# Patient Record
Sex: Female | Born: 1970 | Race: White | Hispanic: No | Marital: Single | Smoking: Former smoker
Health system: Southern US, Community
[De-identification: ages and names within clinical notes are randomized; demographics above are authoritative.]

## PROBLEM LIST (undated history)

## (undated) DIAGNOSIS — K219 Gastro-esophageal reflux disease without esophagitis: Secondary | ICD-10-CM

## (undated) DIAGNOSIS — G4700 Insomnia, unspecified: Secondary | ICD-10-CM

## (undated) DIAGNOSIS — F410 Panic disorder [episodic paroxysmal anxiety] without agoraphobia: Secondary | ICD-10-CM

## (undated) DIAGNOSIS — M62838 Other muscle spasm: Secondary | ICD-10-CM

## (undated) DIAGNOSIS — I10 Essential (primary) hypertension: Secondary | ICD-10-CM

## (undated) HISTORY — PX: PARTIAL HYSTERECTOMY: SHX80

## (undated) HISTORY — DX: Other muscle spasm: M62.838

## (undated) HISTORY — PX: ABDOMINAL HYSTERECTOMY: SHX81

---

## 1998-10-23 ENCOUNTER — Other Ambulatory Visit: Admission: RE | Admit: 1998-10-23 | Discharge: 1998-10-23 | Payer: Self-pay | Admitting: *Deleted

## 2002-08-23 ENCOUNTER — Encounter: Payer: Self-pay | Admitting: Orthopaedic Surgery

## 2002-08-23 ENCOUNTER — Encounter (HOSPITAL_COMMUNITY): Admission: RE | Admit: 2002-08-23 | Discharge: 2002-09-22 | Payer: Self-pay | Admitting: Orthopaedic Surgery

## 2003-02-11 ENCOUNTER — Emergency Department (HOSPITAL_COMMUNITY): Admission: EM | Admit: 2003-02-11 | Discharge: 2003-02-11 | Payer: Self-pay | Admitting: Emergency Medicine

## 2003-05-23 ENCOUNTER — Emergency Department (HOSPITAL_COMMUNITY): Admission: EM | Admit: 2003-05-23 | Discharge: 2003-05-23 | Payer: Self-pay | Admitting: Emergency Medicine

## 2003-07-24 ENCOUNTER — Emergency Department (HOSPITAL_COMMUNITY): Admission: EM | Admit: 2003-07-24 | Discharge: 2003-07-24 | Payer: Self-pay | Admitting: Emergency Medicine

## 2004-01-22 ENCOUNTER — Emergency Department: Payer: Self-pay | Admitting: Internal Medicine

## 2004-03-03 ENCOUNTER — Emergency Department (HOSPITAL_COMMUNITY): Admission: EM | Admit: 2004-03-03 | Discharge: 2004-03-03 | Payer: Self-pay | Admitting: Emergency Medicine

## 2004-03-20 ENCOUNTER — Emergency Department (HOSPITAL_COMMUNITY): Admission: EM | Admit: 2004-03-20 | Discharge: 2004-03-20 | Payer: Self-pay | Admitting: *Deleted

## 2004-04-02 ENCOUNTER — Emergency Department (HOSPITAL_COMMUNITY): Admission: EM | Admit: 2004-04-02 | Discharge: 2004-04-02 | Payer: Self-pay | Admitting: Emergency Medicine

## 2004-06-02 ENCOUNTER — Emergency Department: Payer: Self-pay | Admitting: Emergency Medicine

## 2004-10-06 ENCOUNTER — Emergency Department: Payer: Self-pay | Admitting: Emergency Medicine

## 2005-10-25 ENCOUNTER — Emergency Department: Payer: Self-pay | Admitting: Emergency Medicine

## 2005-12-22 ENCOUNTER — Emergency Department: Payer: Self-pay | Admitting: Emergency Medicine

## 2006-03-15 ENCOUNTER — Emergency Department: Payer: Self-pay | Admitting: Emergency Medicine

## 2006-03-19 ENCOUNTER — Emergency Department: Payer: Self-pay | Admitting: Emergency Medicine

## 2006-04-13 ENCOUNTER — Ambulatory Visit: Payer: Self-pay | Admitting: Obstetrics and Gynecology

## 2006-04-13 ENCOUNTER — Other Ambulatory Visit: Payer: Self-pay

## 2006-04-15 ENCOUNTER — Ambulatory Visit: Payer: Self-pay | Admitting: Obstetrics and Gynecology

## 2006-07-19 ENCOUNTER — Ambulatory Visit: Payer: Self-pay | Admitting: Obstetrics and Gynecology

## 2006-07-25 ENCOUNTER — Ambulatory Visit: Payer: Self-pay | Admitting: Obstetrics and Gynecology

## 2006-08-11 ENCOUNTER — Emergency Department: Payer: Self-pay | Admitting: Emergency Medicine

## 2006-08-14 ENCOUNTER — Emergency Department (HOSPITAL_COMMUNITY): Admission: EM | Admit: 2006-08-14 | Discharge: 2006-08-14 | Payer: Self-pay | Admitting: Emergency Medicine

## 2006-08-17 ENCOUNTER — Emergency Department: Payer: Self-pay | Admitting: Unknown Physician Specialty

## 2006-09-13 ENCOUNTER — Emergency Department (HOSPITAL_COMMUNITY): Admission: EM | Admit: 2006-09-13 | Discharge: 2006-09-13 | Payer: Self-pay | Admitting: Emergency Medicine

## 2006-09-29 ENCOUNTER — Ambulatory Visit: Payer: Self-pay | Admitting: Chiropractor

## 2006-10-23 ENCOUNTER — Emergency Department (HOSPITAL_COMMUNITY): Admission: EM | Admit: 2006-10-23 | Discharge: 2006-10-23 | Payer: Self-pay | Admitting: Emergency Medicine

## 2006-11-12 ENCOUNTER — Emergency Department: Payer: Self-pay

## 2007-01-16 ENCOUNTER — Ambulatory Visit: Payer: Self-pay | Admitting: Family Medicine

## 2007-06-01 ENCOUNTER — Emergency Department (HOSPITAL_COMMUNITY): Admission: EM | Admit: 2007-06-01 | Discharge: 2007-06-01 | Payer: Self-pay | Admitting: Emergency Medicine

## 2007-08-17 ENCOUNTER — Ambulatory Visit (HOSPITAL_COMMUNITY): Admission: RE | Admit: 2007-08-17 | Discharge: 2007-08-17 | Payer: Self-pay | Admitting: Family Medicine

## 2008-01-04 ENCOUNTER — Ambulatory Visit (HOSPITAL_COMMUNITY): Admission: RE | Admit: 2008-01-04 | Discharge: 2008-01-04 | Payer: Self-pay | Admitting: Orthopaedic Surgery

## 2008-01-09 ENCOUNTER — Encounter (HOSPITAL_COMMUNITY): Admission: RE | Admit: 2008-01-09 | Discharge: 2008-02-01 | Payer: Self-pay | Admitting: Orthopaedic Surgery

## 2008-02-06 ENCOUNTER — Encounter (HOSPITAL_COMMUNITY): Admission: RE | Admit: 2008-02-06 | Discharge: 2008-03-07 | Payer: Self-pay | Admitting: Orthopaedic Surgery

## 2008-05-04 ENCOUNTER — Emergency Department (HOSPITAL_COMMUNITY): Admission: EM | Admit: 2008-05-04 | Discharge: 2008-05-04 | Payer: Self-pay | Admitting: Emergency Medicine

## 2010-12-01 ENCOUNTER — Ambulatory Visit: Payer: Self-pay | Admitting: Otolaryngology

## 2010-12-18 ENCOUNTER — Ambulatory Visit: Payer: Self-pay | Admitting: Otolaryngology

## 2011-09-06 ENCOUNTER — Inpatient Hospital Stay: Payer: Self-pay | Admitting: Student

## 2011-09-06 LAB — CBC
HCT: 39.9 % (ref 35.0–47.0)
HGB: 13.3 g/dL (ref 12.0–16.0)
MCH: 29.7 pg (ref 26.0–34.0)
MCV: 89 fL (ref 80–100)
Platelet: 287 10*3/uL (ref 150–440)
RBC: 4.46 10*6/uL (ref 3.80–5.20)
RDW: 13.9 % (ref 11.5–14.5)
WBC: 28.8 10*3/uL — ABNORMAL HIGH (ref 3.6–11.0)

## 2011-09-06 LAB — URINALYSIS, COMPLETE
Bacteria: NONE SEEN
Bilirubin,UR: NEGATIVE
Blood: NEGATIVE
Leukocyte Esterase: NEGATIVE
Nitrite: NEGATIVE
Protein: NEGATIVE
RBC,UR: <1 /HPF (ref 0–5)
Squamous Epithelial: NONE SEEN
WBC UR: 1 /HPF (ref 0–5)

## 2011-09-06 LAB — COMPREHENSIVE METABOLIC PANEL
Albumin: 3.7 g/dL (ref 3.4–5.0)
Alkaline Phosphatase: 60 U/L (ref 50–136)
Bilirubin,Total: 0.3 mg/dL (ref 0.2–1.0)
Calcium, Total: 9.4 mg/dL (ref 8.5–10.1)
Co2: 27 mmol/L (ref 21–32)
Creatinine: 1 mg/dL (ref 0.60–1.30)
EGFR (African American): >60
Glucose: 128 mg/dL — ABNORMAL HIGH (ref 65–99)
Osmolality: 286 (ref 275–301)
Potassium: 2.8 mmol/L — ABNORMAL LOW (ref 3.5–5.1)
SGOT(AST): 31 U/L (ref 15–37)
SGPT (ALT): 36 U/L (ref 12–78)
Sodium: 139 mmol/L (ref 136–145)
Total Protein: 7 g/dL (ref 6.4–8.2)

## 2011-09-06 LAB — DIFFERENTIAL
Basophil #: 0.2 10*3/uL — ABNORMAL HIGH (ref 0.0–0.1)
Eosinophil #: 0.1 10*3/uL (ref 0.0–0.7)
Lymphocyte #: 1.7 10*3/uL (ref 1.0–3.6)
Lymphocyte %: 5.8 %
Monocyte %: 5.5 %

## 2011-09-06 LAB — WBC: WBC: 25.1 10*3/uL — ABNORMAL HIGH (ref 3.6–11.0)

## 2011-09-07 LAB — BASIC METABOLIC PANEL
Anion Gap: 6 — ABNORMAL LOW (ref 7–16)
Calcium, Total: 7.8 mg/dL — ABNORMAL LOW (ref 8.5–10.1)
Chloride: 110 mmol/L — ABNORMAL HIGH (ref 98–107)
Co2: 24 mmol/L (ref 21–32)
Creatinine: 0.67 mg/dL (ref 0.60–1.30)
Osmolality: 280 (ref 275–301)

## 2011-09-07 LAB — CBC WITH DIFFERENTIAL/PLATELET
Eosinophil #: 0.5 10*3/uL (ref 0.0–0.7)
HGB: 11.2 g/dL — ABNORMAL LOW (ref 12.0–16.0)
Lymphocyte #: 1.5 10*3/uL (ref 1.0–3.6)
Lymphocyte %: 10.7 %
MCHC: 33.3 g/dL (ref 32.0–36.0)
MCV: 90 fL (ref 80–100)
Monocyte #: 1.2 x10 3/mm — ABNORMAL HIGH (ref 0.2–0.9)
Monocyte %: 9 %
Neutrophil %: 76.2 %
Platelet: 169 10*3/uL (ref 150–440)
RBC: 3.74 10*6/uL — ABNORMAL LOW (ref 3.80–5.20)
RDW: 14.1 % (ref 11.5–14.5)
WBC: 13.9 10*3/uL — ABNORMAL HIGH (ref 3.6–11.0)

## 2011-09-07 LAB — WBCS, STOOL

## 2011-09-07 LAB — HEMOGLOBIN: HGB: 10.7 g/dL — ABNORMAL LOW (ref 12.0–16.0)

## 2011-09-08 LAB — CBC WITH DIFFERENTIAL/PLATELET
Basophil #: 0.1 10*3/uL (ref 0.0–0.1)
Basophil %: 0.6 %
Eosinophil %: 4.5 %
HCT: 29.3 % — ABNORMAL LOW (ref 35.0–47.0)
HGB: 10 g/dL — ABNORMAL LOW (ref 12.0–16.0)
Lymphocyte #: 1.8 10*3/uL (ref 1.0–3.6)
Lymphocyte %: 13.4 %
MCV: 89 fL (ref 80–100)
Monocyte #: 1.1 x10 3/mm — ABNORMAL HIGH (ref 0.2–0.9)
Platelet: 160 10*3/uL (ref 150–440)
RDW: 14.2 % (ref 11.5–14.5)
WBC: 13.6 10*3/uL — ABNORMAL HIGH (ref 3.6–11.0)

## 2011-09-09 LAB — STOOL CULTURE

## 2011-09-12 LAB — CULTURE, BLOOD (SINGLE)

## 2013-04-25 ENCOUNTER — Emergency Department (HOSPITAL_COMMUNITY)
Admission: EM | Admit: 2013-04-25 | Discharge: 2013-04-25 | Disposition: A | Discharge status: Home / Self Care | Payer: Medicare Other | Attending: Emergency Medicine | Admitting: Emergency Medicine

## 2013-04-25 ENCOUNTER — Encounter (HOSPITAL_COMMUNITY): Payer: Self-pay | Admitting: Emergency Medicine

## 2013-04-25 DIAGNOSIS — S40011A Contusion of right shoulder, initial encounter: Secondary | ICD-10-CM

## 2013-04-25 DIAGNOSIS — S0003XA Contusion of scalp, initial encounter: Secondary | ICD-10-CM | POA: Insufficient documentation

## 2013-04-25 DIAGNOSIS — I10 Essential (primary) hypertension: Secondary | ICD-10-CM | POA: Insufficient documentation

## 2013-04-25 DIAGNOSIS — Y929 Unspecified place or not applicable: Secondary | ICD-10-CM | POA: Insufficient documentation

## 2013-04-25 DIAGNOSIS — Y9389 Activity, other specified: Secondary | ICD-10-CM | POA: Insufficient documentation

## 2013-04-25 DIAGNOSIS — S1093XA Contusion of unspecified part of neck, initial encounter: Principal | ICD-10-CM

## 2013-04-25 DIAGNOSIS — S0083XA Contusion of other part of head, initial encounter: Principal | ICD-10-CM

## 2013-04-25 DIAGNOSIS — S0993XA Unspecified injury of face, initial encounter: Secondary | ICD-10-CM | POA: Insufficient documentation

## 2013-04-25 DIAGNOSIS — F411 Generalized anxiety disorder: Secondary | ICD-10-CM | POA: Insufficient documentation

## 2013-04-25 DIAGNOSIS — S199XXA Unspecified injury of neck, initial encounter: Secondary | ICD-10-CM

## 2013-04-25 DIAGNOSIS — S46909A Unspecified injury of unspecified muscle, fascia and tendon at shoulder and upper arm level, unspecified arm, initial encounter: Secondary | ICD-10-CM | POA: Insufficient documentation

## 2013-04-25 DIAGNOSIS — Z8719 Personal history of other diseases of the digestive system: Secondary | ICD-10-CM | POA: Insufficient documentation

## 2013-04-25 DIAGNOSIS — S40019A Contusion of unspecified shoulder, initial encounter: Secondary | ICD-10-CM | POA: Insufficient documentation

## 2013-04-25 DIAGNOSIS — S4980XA Other specified injuries of shoulder and upper arm, unspecified arm, initial encounter: Secondary | ICD-10-CM | POA: Insufficient documentation

## 2013-04-25 DIAGNOSIS — IMO0002 Reserved for concepts with insufficient information to code with codable children: Secondary | ICD-10-CM | POA: Insufficient documentation

## 2013-04-25 HISTORY — DX: Essential (primary) hypertension: I10

## 2013-04-25 HISTORY — DX: Gastro-esophageal reflux disease without esophagitis: K21.9

## 2013-04-25 HISTORY — DX: Panic disorder (episodic paroxysmal anxiety): F41.0

## 2013-04-25 MED ORDER — HYDROCODONE-ACETAMINOPHEN 5-325 MG PO TABS: 1.0000 | ORAL_TABLET | ORAL | Status: DC | PRN

## 2013-04-25 MED ORDER — ONDANSETRON HCL 4 MG PO TABS: 4.0000 mg | ORAL_TABLET | Freq: Once | ORAL | Status: DC

## 2013-04-25 MED ORDER — HYDROCODONE-ACETAMINOPHEN 5-325 MG PO TABS: 2.0000 | ORAL_TABLET | Freq: Once | ORAL | Status: DC

## 2013-04-25 NOTE — Discharge Instructions (Signed)
Please apply ice to the back of her head in your shoulder. Please use Tylenol for mild pain, use Norco for more severe pain. This medication may cause drowsiness. Please use with caution. Please see your physician, or return to the emergency department if any changes or problems with your condition. Contusion A contusion is a deep bruise. Contusions happen when an injury causes bleeding under the skin. Signs of bruising include pain, puffiness (swelling), and discolored skin. The contusion may turn blue, purple, or yellow. HOME CARE   Put ice on the injured area.  Put ice in a plastic bag.  Place a towel between your skin and the bag.  Leave the ice on for 15-20 minutes, 03-04 times a day.  Only take medicine as told by your doctor.  Rest the injured area.  If possible, raise (elevate) the injured area to lessen puffiness. GET HELP RIGHT AWAY IF:   You have more bruising or puffiness.  You have pain that is getting worse.  Your puffiness or pain is not helped by medicine. MAKE SURE YOU:   Understand these instructions.  Will watch your condition.  Will get help right away if you are not doing well or get worse. Document Released: 07/07/2007 Document Revised: 04/12/2011 Document Reviewed: 11/23/2010 [facility] Patient Information 2014 [place]an[place]ExitCare, [place].
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
## 2013-04-25 NOTE — ED Provider Notes (Signed)
Medical screening examination/treatment/procedure(s) were performed by non-physician practitioner and as supervising physician I was immediately available for consultation/collaboration.   EKG Interpretation None        Charles B. Sheldon, MD 04/25/13 2323 
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
## 2013-04-25 NOTE — ED Notes (Signed)
Patient given discharge instruction, verbalized understand. Patient ambulatory out of the department.  

## 2013-06-23 ENCOUNTER — Emergency Department (HOSPITAL_COMMUNITY)
Admission: EM | Admit: 2013-06-23 | Discharge: 2013-06-23 | Disposition: A | Discharge status: Home / Self Care | Payer: Medicare Other | Attending: Emergency Medicine | Admitting: Emergency Medicine

## 2013-06-23 ENCOUNTER — Encounter (HOSPITAL_COMMUNITY): Payer: Self-pay | Admitting: Emergency Medicine

## 2013-06-23 ENCOUNTER — Emergency Department (HOSPITAL_COMMUNITY): Payer: Medicare Other

## 2013-06-23 DIAGNOSIS — M545 Low back pain, unspecified: Secondary | ICD-10-CM

## 2013-06-23 DIAGNOSIS — I10 Essential (primary) hypertension: Secondary | ICD-10-CM | POA: Insufficient documentation

## 2013-06-23 DIAGNOSIS — S0083XA Contusion of other part of head, initial encounter: Secondary | ICD-10-CM | POA: Insufficient documentation

## 2013-06-23 DIAGNOSIS — S60229A Contusion of unspecified hand, initial encounter: Secondary | ICD-10-CM | POA: Insufficient documentation

## 2013-06-23 DIAGNOSIS — Z8719 Personal history of other diseases of the digestive system: Secondary | ICD-10-CM | POA: Insufficient documentation

## 2013-06-23 DIAGNOSIS — Z8659 Personal history of other mental and behavioral disorders: Secondary | ICD-10-CM | POA: Insufficient documentation

## 2013-06-23 DIAGNOSIS — IMO0002 Reserved for concepts with insufficient information to code with codable children: Secondary | ICD-10-CM | POA: Insufficient documentation

## 2013-06-23 DIAGNOSIS — S60222A Contusion of left hand, initial encounter: Secondary | ICD-10-CM

## 2013-06-23 DIAGNOSIS — S0081XA Abrasion of other part of head, initial encounter: Secondary | ICD-10-CM

## 2013-06-23 DIAGNOSIS — S0003XA Contusion of scalp, initial encounter: Secondary | ICD-10-CM | POA: Insufficient documentation

## 2013-06-23 DIAGNOSIS — S1093XA Contusion of unspecified part of neck, initial encounter: Principal | ICD-10-CM

## 2013-06-23 DIAGNOSIS — Y92009 Unspecified place in unspecified non-institutional (private) residence as the place of occurrence of the external cause: Secondary | ICD-10-CM

## 2013-06-23 DIAGNOSIS — Z23 Encounter for immunization: Secondary | ICD-10-CM | POA: Insufficient documentation

## 2013-06-23 DIAGNOSIS — M542 Cervicalgia: Secondary | ICD-10-CM

## 2013-06-23 MED ORDER — TETANUS-DIPHTH-ACELL PERTUSSIS 5-2.5-18.5 LF-MCG/0.5 IM SUSP
0.5000 mL | Freq: Once | INTRAMUSCULAR | Status: AC
  Administered 2013-06-23: 0.5 mL via INTRAMUSCULAR
  Filled 2013-06-23: qty 0.5

## 2013-06-23 MED ORDER — TRAMADOL HCL 50 MG PO TABS
50.0000 mg | ORAL_TABLET | Freq: Once | ORAL | Status: AC
  Administered 2013-06-23: 50 mg via ORAL
  Filled 2013-06-23: qty 1
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
## 2013-08-05 IMAGING — US ULTRASOUND CORE BIOPSY
1 series · 10 of 10 positions shown · non-contrast
Comparison: none

REASON FOR EXAM: Lt Thyroid Nodule
COMMENTS:

PROCEDURE:     US  - US GUIDED BX/ASPIRATION NOT BR  - December 18, 2010  [DATE]
RESULT:     Thyroid Biopsy
INDICATION: Left thyroid nodule

[Series 1: ultrasound core biopsy · 10 of 10 slices shown]
[im 1/10]
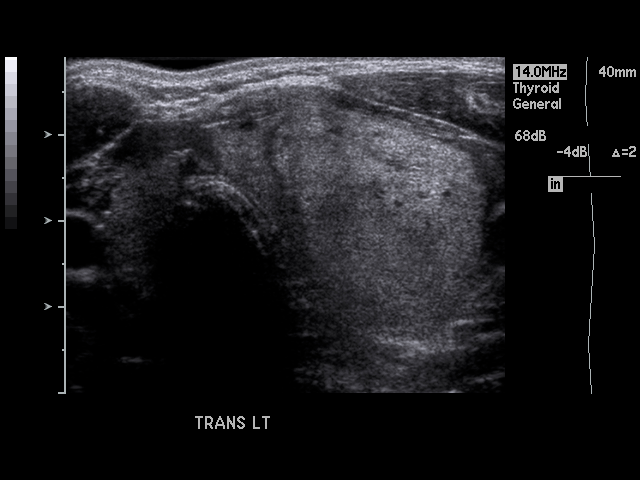
[im 2/10]
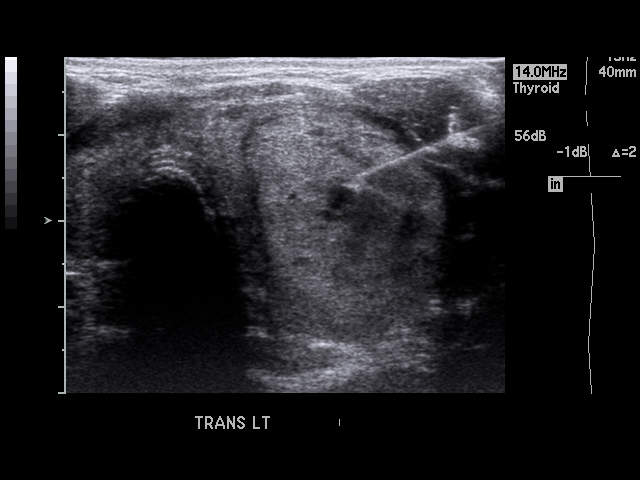
[im 3/10]
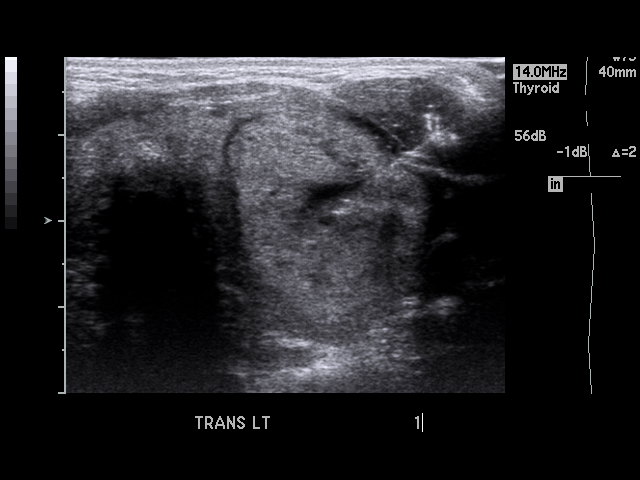
[im 4/10]
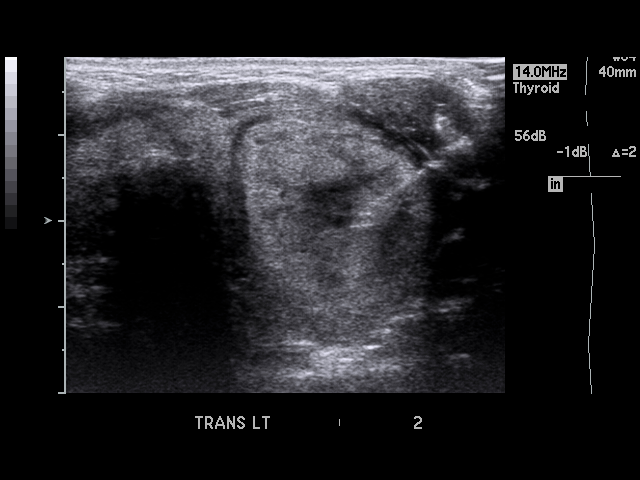
[im 5/10]
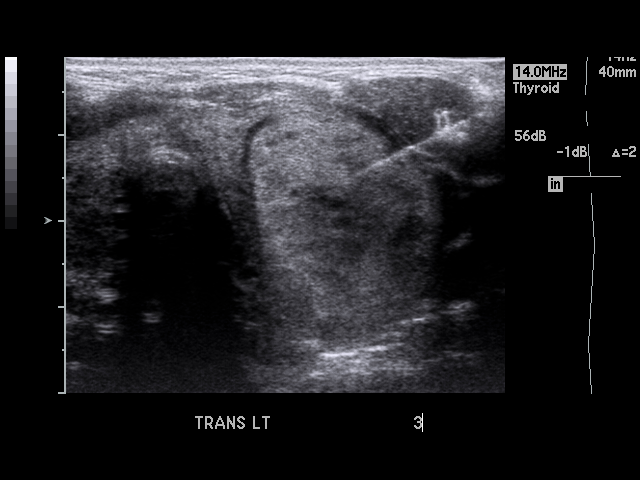
[im 6/10]
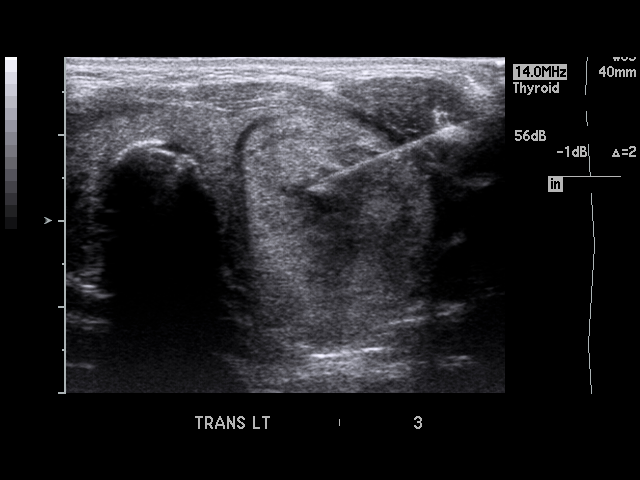
[im 7/10]
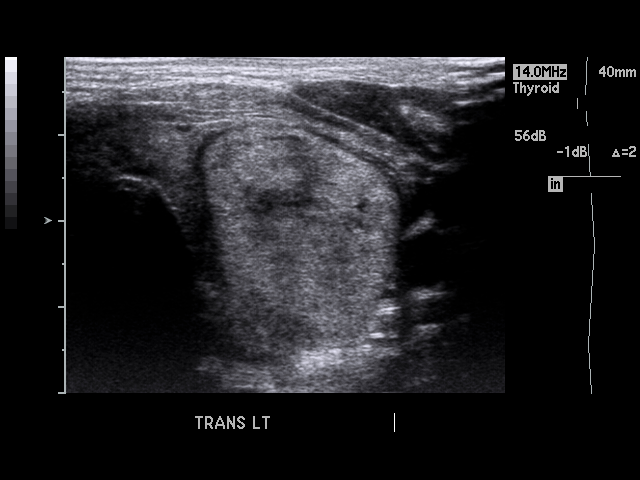
[im 8/10]
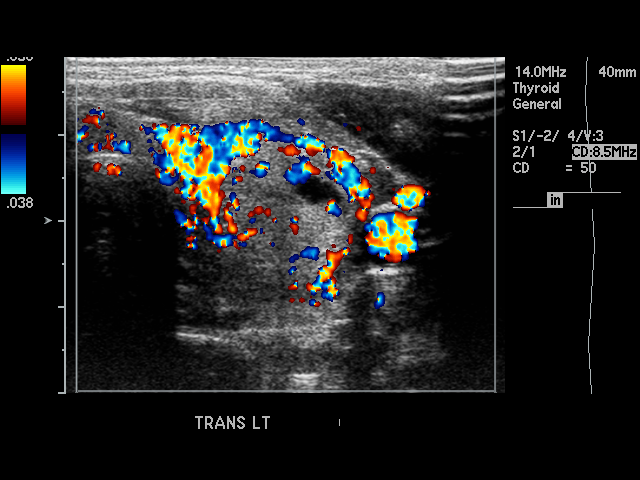
[im 9/10]
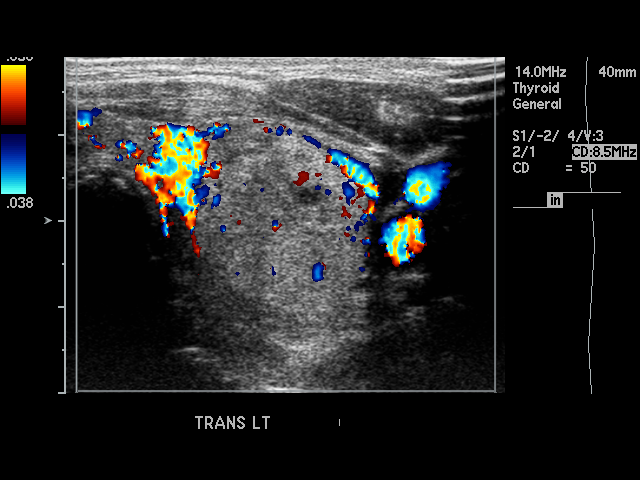
[im 10/10]
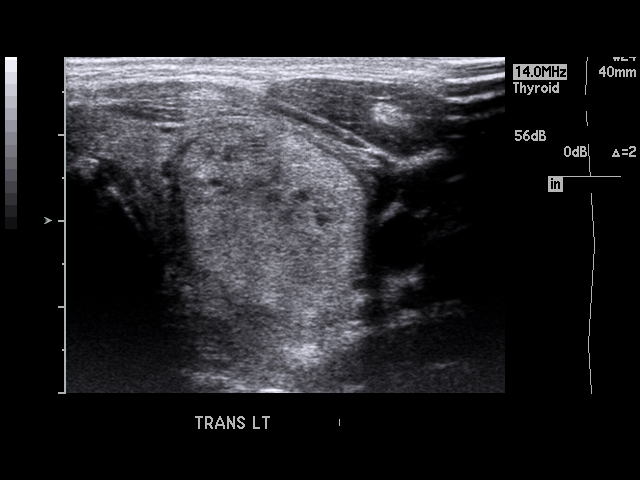

[10 of 10 positions shown; findings below may reference images not displayed]

Comparisons:  None

Procedure: Clinical assessment was performed and informed consent obtained.
Grayscale and color-flow images of the thyroid gland were obtained to
localize the nodule for biopsy. A formal timeout procedure was performed
according to departmental protocol. The neck was prepped and draped in the
usual sterile manner and the overlying skin was anesthetized with 1%
lidocaine.

Under ultrasound guidance a 25 gauge needle was inserted into the nodule a
fine needle aspirate sample obtained. Two more samples were obtained using
similar technique. Cytopathology was present during the procedure and
verified the samples.  The needle was removed, the neck cleaned, and a
sterile bandage applied.

The procedure was well tolerated and without complication.  The patient was
transferred to the recovery unit in stable condition.
IMPRESSION: Ultrasound-guided fine needle aspirate of a left lobe hyperechoic nodule.

## 2013-09-06 ENCOUNTER — Emergency Department (HOSPITAL_COMMUNITY)
Admission: EM | Admit: 2013-09-06 | Discharge: 2013-09-06 | Disposition: A | Discharge status: Home / Self Care | Payer: Medicare Other | Attending: Emergency Medicine | Admitting: Emergency Medicine

## 2013-09-06 ENCOUNTER — Encounter (HOSPITAL_COMMUNITY): Payer: Self-pay | Admitting: Emergency Medicine

## 2013-09-06 DIAGNOSIS — Z8719 Personal history of other diseases of the digestive system: Secondary | ICD-10-CM | POA: Insufficient documentation

## 2013-09-06 DIAGNOSIS — F411 Generalized anxiety disorder: Secondary | ICD-10-CM | POA: Diagnosis not present

## 2013-09-06 DIAGNOSIS — J069 Acute upper respiratory infection, unspecified: Secondary | ICD-10-CM | POA: Insufficient documentation

## 2013-09-06 DIAGNOSIS — J329 Chronic sinusitis, unspecified: Secondary | ICD-10-CM | POA: Insufficient documentation

## 2013-09-06 DIAGNOSIS — I10 Essential (primary) hypertension: Secondary | ICD-10-CM | POA: Insufficient documentation

## 2013-09-06 DIAGNOSIS — J029 Acute pharyngitis, unspecified: Secondary | ICD-10-CM | POA: Diagnosis present

## 2013-09-06 DIAGNOSIS — J019 Acute sinusitis, unspecified: Secondary | ICD-10-CM

## 2013-09-06 DIAGNOSIS — F17200 Nicotine dependence, unspecified, uncomplicated: Secondary | ICD-10-CM | POA: Diagnosis not present

## 2013-09-06 MED ORDER — ACETAMINOPHEN 500 MG PO TABS
1000.0000 mg | ORAL_TABLET | Freq: Once | ORAL | Status: AC
  Administered 2013-09-06: 1000 mg via ORAL
  Filled 2013-09-06: qty 2

## 2013-09-06 MED ORDER — PSEUDOEPHEDRINE HCL 60 MG PO TABS
60.0000 mg | ORAL_TABLET | Freq: Once | ORAL | Status: AC
  Administered 2013-09-06: 60 mg via ORAL
  Filled 2013-09-06: qty 1

## 2013-09-06 NOTE — ED Notes (Signed)
Pt c/o sore throat and bilateral ear pain x3 days.  

## 2013-09-06 NOTE — Discharge Instructions (Signed)
Please rest your voice as much a possible. Salt water gargles may be helpful. Sudafed for congestion. Tylenol for pain or fever. Wash hands frequently. See Dr Welton FlakesKhan for follow up and recheck. Sinusitis Sinusitis is redness, soreness, and puffiness (inflammation) of the air pockets in the bones of your face (sinuses). The redness, soreness, and puffiness can cause air and mucus to get trapped in your sinuses. This can allow germs to grow and cause an infection.  HOME CARE   Drink enough fluids to keep your pee (urine) clear or pale yellow.  Use a humidifier in your home.  Run a hot shower to create steam in the bathroom. Sit in the bathroom with the door closed. Breathe in the steam 3-4 times a day.  Put a warm, moist washcloth on your face 3-4 times a day, or as told by your doctor.  Use salt water sprays (saline sprays) to wet the thick fluid in your nose. This can help the sinuses drain.  Only take medicine as told by your doctor. GET HELP RIGHT AWAY IF:   Your pain gets worse.  You have very bad headaches.  You are sick to your stomach (nauseous).  You throw up (vomit).  You are very sleepy (drowsy) all the time.  Your face is puffy (swollen).  Your vision changes.  You have a stiff neck.  You have trouble breathing. MAKE SURE YOU:   Understand these instructions.  Will watch your condition.  Will get help right away if you are not doing well or get worse. Document Released: 07/07/2007 Document Revised: 10/13/2011 Document Reviewed: 08/24/2011 [facility] Patient Information 2015 [place], [place]. This information is not intended to replace advice given to you by your health care provider. Make sure you discuss any questions you have with your health care provider.

## 2013-09-06 NOTE — ED Provider Notes (Signed)
CSN: [id number]     Arrival date & time 09/06/13  1558 History   First MD Initiated Contact with Patient 09/06/13 1612     Chief Complaint  Patient presents with  . Sore Throat     (Consider location/radiation/quality/duration/timing/severity/associated sxs/prior Treatment) Patient is a 43 y.o. female presenting with pharyngitis. The history is provided by the patient.  Sore Throat This is a new problem. The current episode started in the past 7 days. The problem occurs constantly. The problem has been gradually worsening. Associated symptoms include chills, a sore throat and swollen glands. Pertinent negatives include no abdominal pain, arthralgias, chest pain, coughing, fever, neck pain, rash or vomiting. Associated symptoms comments: Ear pain. The symptoms are aggravated by swallowing. She has tried nothing for the symptoms. The treatment provided no relief.    Past Medical History  Diagnosis Date  . Hypertension   . Acid reflux   . Panic attacks    History reviewed. No pertinent past surgical history. No family history on file. History  Substance Use Topics  . Smoking status: Current Every Day Smoker    Types: Cigarettes  . Smokeless tobacco: Not on file  . Alcohol Use: No   OB History   Grav Para Term Preterm Abortions TAB SAB Ect Mult Living                 Review of Systems  Constitutional: Positive for chills. Negative for fever and activity change.       All ROS Neg except as noted in HPI  HENT: Positive for sore throat.   Eyes: Negative for photophobia and discharge.  Respiratory: Negative for cough, shortness of breath and wheezing.   Cardiovascular: Negative for chest pain and palpitations.  Gastrointestinal: Negative for vomiting, abdominal pain and blood in stool.  Genitourinary: Negative for dysuria, frequency and hematuria.  Musculoskeletal: Negative for arthralgias, back pain and neck pain.  Skin: Negative.  Negative for rash.  Neurological: Negative for  dizziness, seizures and speech difficulty.  Psychiatric/Behavioral: Negative for hallucinations and confusion. The patient is nervous/anxious.       Allergies  Aspirin  Home Medications   Prior to Admission medications   Medication Sig Start Date End Date Taking? Authorizing Provider  HYDROcodone-acetaminophen (NORCO) 5-325 MG per tablet Take 1 tablet by mouth every 4 (four) hours as needed for moderate pain. 04/25/13   Kathie Dike, PA-C   BP 149/102  Temp(Src) 98.9 F (37.2 C) (Oral)  Resp 16  Ht 5\' 3"  (1.6 m)  Wt 106 lb (48.081 kg)  BMI 18.78 kg/m2  SpO2 100% Physical Exam  Nursing note and vitals reviewed. Constitutional: She is oriented to person, place, and time. She appears well-developed and well-nourished.  Non-toxic appearance.  HENT:  Head: Normocephalic.  Right Ear: Tympanic membrane and external ear normal.  Left Ear: Tympanic membrane and external ear normal.  Nasal congestion Hoarseness  Eyes: EOM and lids are normal. Pupils are equal, round, and reactive to light.  Neck: Normal range of motion. Neck supple. Carotid bruit is not present.  Mild to mod thyroid enlargement.  Cardiovascular: Normal rate, regular rhythm, normal heart sounds, intact distal pulses and normal pulses.   Pulmonary/Chest: Breath sounds normal. No respiratory distress.  Abdominal: Soft. Bowel sounds are normal. There is no tenderness. There is no guarding.  Musculoskeletal: Normal range of motion.  Lymphadenopathy:       Head (right side): No submandibular adenopathy present.       Head (left side):  No submandibular adenopathy present.    She has no cervical adenopathy.  Neurological: She is alert and oriented to person, place, and time. She has normal strength. No cranial nerve deficit or sensory deficit. She exhibits normal muscle tone. Coordination normal.  Skin: Skin is warm and dry.  Psychiatric: She has a normal mood and affect. Her speech is normal.    ED Course   Procedures (including critical care time) Labs Review Labs Reviewed - No data to display  Imaging Review No results found.   EKG Interpretation None      MDM Pt presents to the ED with 3 days of sore throat, ear ache, hoarseness and body aches. B/P elevated, but pt states she has not taken her meds for 2 days. Pt advised to use salt water gargles and tylenol for discomfort. Pt encouraged to use sudafed for congestion, and see PCP for follow up and recheck.   Final diagnoses:  None    *I have reviewed nursing notes, vital signs, and all appropriate lab and imaging results for this patient.Kathie Dike*    Lyndle Pang M Audelia Knape, PA-C 09/06/13 1624

## 2013-09-06 NOTE — ED Provider Notes (Signed)
Medical screening examination/treatment/procedure(s) were performed by non-physician practitioner and as supervising physician I was immediately available for consultation/collaboration.   EKG Interpretation None        Glynn OctaveStephen Matthan Sledge, MD 09/06/13 [phone]

## 2013-11-03 ENCOUNTER — Emergency Department (HOSPITAL_COMMUNITY): Payer: Medicare Other

## 2013-11-03 ENCOUNTER — Encounter (HOSPITAL_COMMUNITY): Payer: Self-pay | Admitting: Emergency Medicine

## 2013-11-03 ENCOUNTER — Emergency Department (HOSPITAL_COMMUNITY)
Admission: EM | Admit: 2013-11-03 | Discharge: 2013-11-03 | Disposition: A | Discharge status: Home / Self Care | Payer: Medicare Other | Attending: Emergency Medicine | Admitting: Emergency Medicine

## 2013-11-03 DIAGNOSIS — Z720 Tobacco use: Secondary | ICD-10-CM | POA: Diagnosis not present

## 2013-11-03 DIAGNOSIS — Z8659 Personal history of other mental and behavioral disorders: Secondary | ICD-10-CM | POA: Diagnosis not present

## 2013-11-03 DIAGNOSIS — Z8719 Personal history of other diseases of the digestive system: Secondary | ICD-10-CM | POA: Diagnosis not present

## 2013-11-03 DIAGNOSIS — I10 Essential (primary) hypertension: Secondary | ICD-10-CM | POA: Diagnosis not present

## 2013-11-03 DIAGNOSIS — R109 Unspecified abdominal pain: Secondary | ICD-10-CM | POA: Diagnosis not present

## 2013-11-03 LAB — URINALYSIS, ROUTINE W REFLEX MICROSCOPIC
Bilirubin Urine: NEGATIVE
GLUCOSE, UA: NEGATIVE mg/dL
Hgb urine dipstick: NEGATIVE
KETONES UR: NEGATIVE mg/dL
LEUKOCYTES UA: NEGATIVE
Nitrite: NEGATIVE
PROTEIN: NEGATIVE mg/dL
Specific Gravity, Urine: 1.01 (ref 1.005–1.030)
Urobilinogen, UA: 0.2 mg/dL (ref 0.0–1.0)
pH: 5.5 (ref 5.0–8.0)

## 2013-11-03 LAB — CBC WITH DIFFERENTIAL/PLATELET
BASOS ABS: 0.1 10*3/uL (ref 0.0–0.1)
BASOS PCT: 1 % (ref 0–1)
EOS PCT: 1 % (ref 0–5)
Eosinophils Absolute: 0.1 10*3/uL (ref 0.0–0.7)
HEMATOCRIT: 37.4 % (ref 36.0–46.0)
Hemoglobin: 13.2 g/dL (ref 12.0–15.0)
Lymphocytes Relative: 10 % — ABNORMAL LOW (ref 12–46)
Lymphs Abs: 1.4 10*3/uL (ref 0.7–4.0)
MCH: 29.3 pg (ref 26.0–34.0)
MCHC: 35.3 g/dL (ref 30.0–36.0)
MCV: 82.9 fL (ref 78.0–100.0)
MONO ABS: 0.9 10*3/uL (ref 0.1–1.0)
Monocytes Relative: 6 % (ref 3–12)
NEUTROS ABS: 11.2 10*3/uL — AB (ref 1.7–7.7)
Neutrophils Relative %: 82 % — ABNORMAL HIGH (ref 43–77)
Platelets: 275 10*3/uL (ref 150–400)
RBC: 4.51 MIL/uL (ref 3.87–5.11)
RDW: 13.2 % (ref 11.5–15.5)
WBC: 13.6 10*3/uL — ABNORMAL HIGH (ref 4.0–10.5)

## 2013-11-03 LAB — COMPREHENSIVE METABOLIC PANEL
ALBUMIN: 4.2 g/dL (ref 3.5–5.2)
ALT: 14 U/L (ref 0–35)
AST: 16 U/L (ref 0–37)
Alkaline Phosphatase: 78 U/L (ref 39–117)
Anion gap: 16 — ABNORMAL HIGH (ref 5–15)
BUN: 18 mg/dL (ref 6–23)
CALCIUM: 10 mg/dL (ref 8.4–10.5)
CO2: 22 mEq/L (ref 19–32)
CREATININE: 0.4 mg/dL — AB (ref 0.50–1.10)
Chloride: 101 mEq/L (ref 96–112)
GFR calc Af Amer: >90 mL/min (ref >90)
GFR calc non Af Amer: >90 mL/min (ref >90)
Glucose, Bld: 191 mg/dL — ABNORMAL HIGH (ref 70–99)
Potassium: 3.8 mEq/L (ref 3.7–5.3)
Sodium: 139 mEq/L (ref 137–147)
Total Bilirubin: 0.5 mg/dL (ref 0.3–1.2)
Total Protein: 7.8 g/dL (ref 6.0–8.3)

## 2013-11-03 MED ORDER — HYDROCODONE-ACETAMINOPHEN 5-325 MG PO TABS: 1.0000 | ORAL_TABLET | ORAL | Status: DC | PRN

## 2013-11-03 MED ORDER — KETOROLAC TROMETHAMINE 60 MG/2ML IM SOLN
60.0000 mg | Freq: Once | INTRAMUSCULAR | Status: AC
  Administered 2013-11-03: 60 mg via INTRAMUSCULAR
  Filled 2013-11-03: qty 2

## 2013-11-03 NOTE — Discharge Instructions (Signed)
Hydrocodone as prescribed as needed for pain.  Followup with your primary Dr. if not improving in the next few days, and return to the ER if you develop any new and concerning symptoms.   Flank Pain Flank pain refers to pain that is located on the side of the body between the upper abdomen and the back. The pain may occur over a short period of time (acute) or may be long-term or reoccurring (chronic). It may be mild or severe. Flank pain can be caused by many things. CAUSES  Some of the more common causes of flank pain include:  Muscle strains.   Muscle spasms.   A disease of your spine (vertebral disk disease).   A lung infection (pneumonia).   Fluid around your lungs (pulmonary edema).   A kidney infection.   Kidney stones.   A very painful skin rash caused by the chickenpox virus (shingles).   Gallbladder disease.  HOME CARE INSTRUCTIONS  Home care will depend on the cause of your pain. In general,  Rest as directed by your caregiver.  Drink enough fluids to keep your urine clear or pale yellow.  Only take over-the-counter or prescription medicines as directed by your caregiver. Some medicines may help relieve the pain.  Tell your caregiver about any changes in your pain.  Follow up with your caregiver as directed. SEEK IMMEDIATE MEDICAL CARE IF:   Your pain is not controlled with medicine.   You have new or worsening symptoms.  Your pain increases.   You have abdominal pain.   You have shortness of breath.   You have persistent nausea or vomiting.   You have swelling in your abdomen.   You feel faint or pass out.   You have blood in your urine.  You have a fever or persistent symptoms for more than 2-3 days.  You have a fever and your symptoms suddenly get worse. MAKE SURE YOU:   Understand these instructions.  Will watch your condition.  Will get help right away if you are not doing well or get worse. Document Released:  03/11/2005 Document Revised: 10/13/2011 Document Reviewed: 09/02/2011 [facility] Patient Information 2015 [place]oresExitCare, [place]LL[place]. This information is not intended to replace advice given to you by your health care provider. Make sure you discuss any questions you have with your health care provider.

## 2013-11-03 NOTE — ED Notes (Addendum)
Pt states that sharp, shooting pain in right side began around 2-3 weeks ago. Pt states that pain radiates toward front of abdomen and back. Pt states that tonight the pain began to radiate toward her lower abdomen/groin area. Pt states that she took her BP and reflux pill but denies taking anything to help with pain. Pt states that she has had 1 beer before coming here. Pt denies vomiting/diarrhea, but has had some nausea. Pt has been able to tolerate food/fluids.

## 2013-11-03 NOTE — ED Provider Notes (Signed)
CSN: [id number]     Arrival date & time 11/03/13  0120 History   First MD Initiated Contact with Patient 11/03/13 0208     Chief Complaint  Patient presents with  . Flank Pain     (Consider location/radiation/quality/duration/timing/severity/associated sxs/prior Treatment) HPI Comments: Patient is a 43 year old female who presents with complaints of pain in the right flank that radiates to the right groin. This is been worsening over the past 2 days. She denies any injury or trauma. She denies any burning with urination or hematuria. She denies any fevers. She denies any prior history of kidney stones.  Patient is a 42 y.o. female presenting with flank pain. The history is provided by the patient.  Flank Pain This is a new problem. The current episode started 2 days ago. The problem occurs constantly. The problem has been gradually worsening. Pertinent negatives include no abdominal pain. Nothing aggravates the symptoms.    Past Medical History  Diagnosis Date  . Hypertension   . Acid reflux   . Panic attacks    History reviewed. No pertinent past surgical history. No family history on file. History  Substance Use Topics  . Smoking status: Current Every Day Smoker    Types: Cigarettes  . Smokeless tobacco: Not on file  . Alcohol Use: No   OB History   Grav Para Term Preterm Abortions TAB SAB Ect Mult Living                 Review of Systems  Gastrointestinal: Negative for abdominal pain.  Genitourinary: Positive for flank pain.  All other systems reviewed and are negative.     Allergies  Aspirin  Home Medications   Prior to Admission medications   Medication Sig Start Date End Date Taking? Authorizing Provider  HYDROcodone-acetaminophen (NORCO) 5-325 MG per tablet Take 1 tablet by mouth every 4 (four) hours as needed for moderate pain. 04/25/13   Kathie Dike, PA-C   BP 138/96  Pulse 114  Temp(Src) 97.6 F (36.4 C) (Oral)  Resp 16  Ht 5\' 3"  (1.6 m)  Wt  106 lb (48.081 kg)  BMI 18.78 kg/m2  SpO2 100% Physical Exam  Nursing note and vitals reviewed. Constitutional: She is oriented to person, place, and time. She appears well-developed and well-nourished. No distress.  HENT:  Head: Normocephalic and atraumatic.  Neck: Normal range of motion. Neck supple.  Cardiovascular: Normal rate and regular rhythm.  Exam reveals no gallop and no friction rub.   No murmur heard. Pulmonary/Chest: Effort normal and breath sounds normal. No respiratory distress. She has no wheezes.  Abdominal: Soft. Bowel sounds are normal. She exhibits no distension and no mass. There is tenderness.  There is tenderness to palpation in the right flank. There is no abdominal tenderness to palpation.  Musculoskeletal: Normal range of motion.  Neurological: She is alert and oriented to person, place, and time.  Skin: Skin is warm and dry. She is not diaphoretic.    ED Course  Procedures (including critical care time) Labs Review Labs Reviewed  URINALYSIS, ROUTINE W REFLEX MICROSCOPIC  CBC WITH DIFFERENTIAL  COMPREHENSIVE METABOLIC PANEL    Imaging Review No results found.   EKG Interpretation None      MDM   Final diagnoses:  None    Patient presents with complaints of right flank pain. Her abdomen is benign and urinalysis is clear. She has a mildly elevated white count, the significance of which I am uncertain. The CT scan shows no  evidence for renal calculus or other emergent past quality. She will be discharged to home with pain medication and when necessary followup.   Geoffery Lyonsouglas Stephano Arrants, MD 11/03/13 [phone]

## 2013-11-24 ENCOUNTER — Encounter (HOSPITAL_COMMUNITY): Payer: Self-pay | Admitting: Emergency Medicine

## 2013-11-24 DIAGNOSIS — I10 Essential (primary) hypertension: Secondary | ICD-10-CM | POA: Insufficient documentation

## 2013-11-24 DIAGNOSIS — J069 Acute upper respiratory infection, unspecified: Secondary | ICD-10-CM | POA: Diagnosis not present

## 2013-11-24 DIAGNOSIS — Z720 Tobacco use: Secondary | ICD-10-CM | POA: Insufficient documentation

## 2013-11-24 DIAGNOSIS — Z8659 Personal history of other mental and behavioral disorders: Secondary | ICD-10-CM | POA: Insufficient documentation

## 2013-11-24 DIAGNOSIS — Z8719 Personal history of other diseases of the digestive system: Secondary | ICD-10-CM | POA: Insufficient documentation

## 2013-11-24 DIAGNOSIS — R51 Headache: Secondary | ICD-10-CM | POA: Insufficient documentation

## 2013-11-24 DIAGNOSIS — R0981 Nasal congestion: Secondary | ICD-10-CM | POA: Diagnosis present

## 2013-11-24 NOTE — ED Notes (Signed)
Pt states she slept in her car earlier this week and began to have cough, congestion, body aches, and a headache.

## 2013-11-25 ENCOUNTER — Emergency Department (HOSPITAL_COMMUNITY)
Admission: EM | Admit: 2013-11-25 | Discharge: 2013-11-25 | Disposition: A | Discharge status: Home / Self Care | Payer: Medicare Other | Attending: Emergency Medicine | Admitting: Emergency Medicine

## 2013-11-25 DIAGNOSIS — J069 Acute upper respiratory infection, unspecified: Secondary | ICD-10-CM

## 2013-11-25 NOTE — ED Provider Notes (Signed)
CSN: [id number]     Arrival date & time 11/24/13  2306 History  This chart was scribed for Joya Gaskinsonald W Ahlaya Ende, MD by Modena JanskyAlbert Thayil, ED Scribe. This patient was seen in room APA11/[facility]11 and the patient's care was started at 12:36 AM.   Chief Complaint  Patient presents with  . Nasal Congestion   Patient is a 43 y.o. female presenting with cough. The history is provided by the patient. No language interpreter was used.  Cough Severity:  Moderate Onset quality:  Gradual Timing:  Intermittent Progression:  Unchanged Chronicity:  New Smoker: no   Relieved by:  None tried Worsened by:  Nothing tried Ineffective treatments:  None tried Associated symptoms: headaches, rhinorrhea and shortness of breath    HPI Comments: Christina Fernandez is a 43 y.o. female who presents to the Emergency Department complaining of moderate intermittent cough that started earlier in the week. She states that she slept in her car one night before her cough developed. She reports that she has associated rhinorrhea, nasal congestion, generalized body aches, and a headache. She states that she has SOB while coughing. She reports a hx of smoking. She denies any hemoptysis.   Past Medical History  Diagnosis Date  . Hypertension   . Acid reflux   . Panic attacks    History reviewed. No pertinent past surgical history. History reviewed. No pertinent family history. History  Substance Use Topics  . Smoking status: Current Every Day Smoker    Types: Cigarettes  . Smokeless tobacco: Not on file  . Alcohol Use: No   OB History   Grav Para Term Preterm Abortions TAB SAB Ect Mult Living                 Review of Systems  HENT: Positive for congestion and rhinorrhea.   Respiratory: Positive for cough and shortness of breath.   Neurological: Positive for headaches.  All other systems reviewed and are negative.  Allergies  Aspirin  Home Medications   Prior to Admission medications   Medication Sig Start Date  End Date Taking? Authorizing Provider  HYDROcodone-acetaminophen (NORCO) 5-325 MG per tablet Take 1 tablet by mouth every 4 (four) hours as needed for moderate pain. 04/25/13   Kathie DikeHobson M Bryant, PA-C  HYDROcodone-acetaminophen (NORCO) 5-325 MG per tablet Take 1-2 tablets by mouth every 4 (four) hours as needed. 11/03/13   Geoffery Lyonsouglas Delo, MD   BP 140/92  Pulse 119  Temp(Src) 98.2 F (36.8 C)  Resp 20  Ht 5\' 3"  (1.6 m)  Wt 106 lb (48.081 kg)  BMI 18.78 kg/m2  SpO2 99% Physical Exam CONSTITUTIONAL: Well developed/well nourished HEAD: Normocephalic/atraumatic EYES: EOMI/PERRL ENMT: Mucous membranes moist, nasal congestion NECK: supple no meningeal signs. No thyromegaly.   SPINE:entire spine nontender CV: S1/S2 noted, no murmurs/rubs/gallops noted LUNGS: Lungs are clear to auscultation bilaterally, no apparent distress ABDOMEN: soft, nontender, no rebound or guarding GU:no cva tenderness NEURO: Pt is awake/alert, moves all extremitiesx4 EXTREMITIES: pulses normal, full ROM SKIN: warm, color normal PSYCH: no abnormalities of mood noted  ED Course  Procedures  DIAGNOSTIC STUDIES: Oxygen Saturation is 99% on RA, normal by my interpretation.    COORDINATION OF CARE: 12:40 AM- Pt advised of plan for treatment which includes medication and pt agrees. Pt well appearing, she has cough/congestion.  Suspect viral URI She is appropriate for d/c home She is ambulatory in the ED without any difficulty   MDM   Final diagnoses:  URI (upper respiratory infection)  Nursing notes including past medical history and social history reviewed and considered in documentation    I personally performed the services described in this documentation, which was scribed in my presence. The recorded information has been reviewed and is accurate.       Joya Gaskinsonald W Coralie Stanke, MD 11/25/13 [phone]

## 2013-11-25 NOTE — ED Notes (Signed)
Discharge instructions given, pt demonstrated teach back and verbal understanding. No concerns voiced.  

## 2013-11-25 NOTE — Discharge Instructions (Signed)
Upper Respiratory Infection, Adult An upper respiratory infection (URI) is also sometimes known as the common cold. The upper respiratory tract includes the nose, sinuses, throat, trachea, and bronchi. Bronchi are the airways leading to the lungs. Most people improve within 1 week, but symptoms can last up to 2 weeks. A residual cough may last even longer.  CAUSES Many different viruses can infect the tissues lining the upper respiratory tract. The tissues become irritated and inflamed and often become very moist. Mucus production is also common. A cold is contagious. You can easily spread the virus to others by oral contact. This includes kissing, sharing a glass, coughing, or sneezing. Touching your mouth or nose and then touching a surface, which is then touched by another person, can also spread the virus. SYMPTOMS  Symptoms typically develop 1 to 3 days after you come in contact with a cold virus. Symptoms vary from person to person. They may include:  Runny nose.  Sneezing.  Nasal congestion.  Sinus irritation.  Sore throat.  Loss of voice (laryngitis).  Cough.  Fatigue.  Muscle aches.  Loss of appetite.  Headache.  Low-grade fever. DIAGNOSIS  You might diagnose your own cold based on familiar symptoms, since most people get a cold 2 to 3 times a year. Your caregiver can confirm this based on your exam. Most importantly, your caregiver can check that your symptoms are not due to another disease such as strep throat, sinusitis, pneumonia, asthma, or epiglottitis. Blood tests, throat tests, and X-rays are not necessary to diagnose a common cold, but they may sometimes be helpful in excluding other more serious diseases. Your caregiver will decide if any further tests are required. RISKS AND COMPLICATIONS  You may be at risk for a more severe case of the common cold if you smoke cigarettes, have chronic heart disease (such as heart failure) or lung disease (such as asthma), or if  you have a weakened immune system. The very young and very old are also at risk for more serious infections. Bacterial sinusitis, middle ear infections, and bacterial pneumonia can complicate the common cold. The common cold can worsen asthma and chronic obstructive pulmonary disease (COPD). Sometimes, these complications can require emergency medical care and may be life-threatening. PREVENTION  The best way to protect against getting a cold is to practice good hygiene. Avoid oral or hand contact with people with cold symptoms. Wash your hands often if contact occurs. There is no clear evidence that vitamin C, vitamin E, echinacea, or exercise reduces the chance of developing a cold. However, it is always recommended to get plenty of rest and practice good nutrition. TREATMENT  Treatment is directed at relieving symptoms. There is no cure. Antibiotics are not effective, because the infection is caused by a virus, not by bacteria. Treatment may include:  Increased fluid intake. Sports drinks offer valuable electrolytes, sugars, and fluids.  Breathing heated mist or steam (vaporizer or shower).  Eating chicken soup or other clear broths, and maintaining good nutrition.  Getting plenty of rest.  Using gargles or lozenges for comfort.  Controlling fevers with ibuprofen or acetaminophen as directed by your caregiver.  Increasing usage of your inhaler if you have asthma. Zinc gel and zinc lozenges, taken in the first 24 hours of the common cold, can shorten the duration and lessen the severity of symptoms. Pain medicines may help with fever, muscle aches, and throat pain. A variety of non-prescription medicines are available to treat congestion and runny nose. Your caregiver   can make recommendations and may suggest nasal or lung inhalers for other symptoms.  HOME CARE INSTRUCTIONS   Only take over-the-counter or prescription medicines for pain, discomfort, or fever as directed by your  caregiver.  Use a warm mist humidifier or inhale steam from a shower to increase air moisture. This may keep secretions moist and make it easier to breathe.  Drink enough water and fluids to keep your urine clear or pale yellow.  Rest as needed.  Return to work when your temperature has returned to normal or as your caregiver advises. You may need to stay home longer to avoid infecting others. You can also use a face mask and careful hand washing to prevent spread of the virus. SEEK MEDICAL CARE IF:   After the first few days, you feel you are getting worse rather than better.  You need your caregiver's advice about medicines to control symptoms.  You develop chills, worsening shortness of breath, or brown or red sputum. These may be signs of pneumonia.  You develop yellow or brown nasal discharge or pain in the face, especially when you bend forward. These may be signs of sinusitis.  You develop a fever, swollen neck glands, pain with swallowing, or white areas in the back of your throat. These may be signs of strep throat. SEEK IMMEDIATE MEDICAL CARE IF:   You have a fever.  You develop severe or persistent headache, ear pain, sinus pain, or chest pain.  You develop wheezing, a prolonged cough, cough up blood, or have a change in your usual mucus (if you have chronic lung disease).  You develop sore muscles or a stiff neck. Document Released: 07/14/2000 Document Revised: 04/12/2011 Document Reviewed: 04/25/2013 [employer] Patient Information 2015 [employer]. This information is not intended to replace advice given to you by your health care provider. Make sure you discuss any questions you have with your health care provider.  

## 2013-12-04 ENCOUNTER — Encounter (HOSPITAL_COMMUNITY): Payer: Self-pay | Admitting: *Deleted

## 2013-12-04 ENCOUNTER — Emergency Department (HOSPITAL_COMMUNITY)
Admission: EM | Admit: 2013-12-04 | Discharge: 2013-12-05 | Disposition: A | Discharge status: Home / Self Care | Payer: Medicare Other | Attending: Emergency Medicine | Admitting: Emergency Medicine

## 2013-12-04 ENCOUNTER — Emergency Department (HOSPITAL_COMMUNITY)
Admission: EM | Admit: 2013-12-04 | Discharge: 2013-12-04 | Disposition: A | Discharge status: Home / Self Care | Payer: Medicare Other | Attending: Emergency Medicine | Admitting: Emergency Medicine

## 2013-12-04 DIAGNOSIS — Z8659 Personal history of other mental and behavioral disorders: Secondary | ICD-10-CM | POA: Insufficient documentation

## 2013-12-04 DIAGNOSIS — Z79899 Other long term (current) drug therapy: Secondary | ICD-10-CM | POA: Insufficient documentation

## 2013-12-04 DIAGNOSIS — Z7952 Long term (current) use of systemic steroids: Secondary | ICD-10-CM | POA: Insufficient documentation

## 2013-12-04 DIAGNOSIS — I10 Essential (primary) hypertension: Secondary | ICD-10-CM | POA: Diagnosis not present

## 2013-12-04 DIAGNOSIS — L298 Other pruritus: Secondary | ICD-10-CM | POA: Diagnosis not present

## 2013-12-04 DIAGNOSIS — Z720 Tobacco use: Secondary | ICD-10-CM | POA: Insufficient documentation

## 2013-12-04 DIAGNOSIS — Z8719 Personal history of other diseases of the digestive system: Secondary | ICD-10-CM | POA: Insufficient documentation

## 2013-12-04 DIAGNOSIS — M545 Low back pain, unspecified: Secondary | ICD-10-CM

## 2013-12-04 DIAGNOSIS — M79605 Pain in left leg: Secondary | ICD-10-CM | POA: Diagnosis present

## 2013-12-04 DIAGNOSIS — L299 Pruritus, unspecified: Secondary | ICD-10-CM

## 2013-12-04 DIAGNOSIS — K219 Gastro-esophageal reflux disease without esophagitis: Secondary | ICD-10-CM | POA: Diagnosis not present

## 2013-12-04 MED ORDER — PREDNISONE 50 MG PO TABS
60.0000 mg | ORAL_TABLET | Freq: Once | ORAL | Status: AC
  Administered 2013-12-04: 60 mg via ORAL
  Filled 2013-12-04 (×2): qty 1

## 2013-12-04 MED ORDER — PREDNISONE 50 MG PO TABS: 50.0000 mg | ORAL_TABLET | Freq: Every day | ORAL | Status: DC

## 2013-12-04 MED ORDER — KETOROLAC TROMETHAMINE 60 MG/2ML IM SOLN
60.0000 mg | Freq: Once | INTRAMUSCULAR | Status: AC
  Administered 2013-12-04: 60 mg via INTRAMUSCULAR
  Filled 2013-12-04: qty 2

## 2013-12-04 MED ORDER — DIPHENHYDRAMINE HCL 25 MG PO CAPS
25.0000 mg | ORAL_CAPSULE | Freq: Once | ORAL | Status: AC
  Administered 2013-12-04: 25 mg via ORAL
  Filled 2013-12-04: qty 1

## 2013-12-04 NOTE — Discharge Instructions (Signed)
Take Claritin or Zyrtec once a day. Take Benadryl every four hours as needed for itching not controlled with Claritin/Zyrtec.  Pruritus  Pruritus is an itch. There are many different problems that can cause an itch. Dry skin is one of the most common causes of itching. Most cases of itching do not require medical attention.  HOME CARE INSTRUCTIONS  Make sure your skin is moistened on a regular basis. A moisturizer that contains petroleum jelly is best for keeping moisture in your skin. If you develop a rash, you may try the following for relief:   Use corticosteroid cream.  Apply cool compresses to the affected areas.  Bathe with Epsom salts or baking soda in the bathwater.  Soak in colloidal oatmeal baths. These are available at your pharmacy.  Apply baking soda paste to the rash. Stir water into baking soda until it reaches a paste-like consistency.  Use an anti-itch lotion.  Take over-the-counter diphenhydramine medicine by mouth as the instructions direct.  Avoid scratching. Scratching may cause the rash to become infected. If itching is very bad, your caregiver may suggest prescription lotions or creams to lessen your symptoms.  Avoid hot showers, which can make itching worse. A cold shower may help with itching as long as you use a moisturizer after the shower. SEEK MEDICAL CARE IF: The itching does not go away after several days. Document Released: 09/30/2010 Document Revised: 06/04/2013 Document Reviewed: 09/30/2010 [facility] Patient Information 2015 [employer], [place]. This information is not intended to replace advice given to you by your health care provider. Make sure you discuss any questions you have with your health care provider.  Prednisone tablets What is this medicine? PREDNISONE (PRED ni sone) is a corticosteroid. It is commonly used to treat inflammation of the skin, joints, lungs, and other organs. Common conditions treated include asthma, allergies, and arthritis. It  is also used for other conditions, such as blood disorders and diseases of the adrenal glands. This medicine may be used for other purposes; ask your health care provider or pharmacist if you have questions. COMMON BRAND NAME(S): Deltasone, Predone, Sterapred, Sterapred DS What should I tell my health care provider before I take this medicine? They need to know if you have any of these conditions: -Cushing's syndrome -diabetes -glaucoma -heart disease -high blood pressure -infection (especially a virus infection such as chickenpox, cold sores, or herpes) -kidney disease -liver disease -mental illness -myasthenia gravis -osteoporosis -seizures -stomach or intestine problems -thyroid disease -an unusual or allergic reaction to lactose, prednisone, other medicines, foods, dyes, or preservatives -pregnant or trying to get pregnant -breast-feeding How should I use this medicine? Take this medicine by mouth with a glass of water. Follow the directions on the prescription label. Take this medicine with food. If you are taking this medicine once a day, take it in the morning. Do not take more medicine than you are told to take. Do not suddenly stop taking your medicine because you may develop a severe reaction. Your doctor will tell you how much medicine to take. If your doctor wants you to stop the medicine, the dose may be slowly lowered over time to avoid any side effects. Talk to your pediatrician regarding the use of this medicine in children. Special care may be needed. Overdosage: If you think you have taken too much of this medicine contact a poison control center or emergency room at once. NOTE: This medicine is only for you. Do not share this medicine with others. What if I miss  a dose? If you miss a dose, take it as soon as you can. If it is almost time for your next dose, talk to your doctor or health care professional. You may need to miss a dose or take an extra dose. Do not take  double or extra doses without advice. What may interact with this medicine? Do not take this medicine with any of the following medications: -metyrapone -mifepristone This medicine may also interact with the following medications: -aminoglutethimide -amphotericin B -aspirin and aspirin-like medicines -barbiturates -certain medicines for diabetes, like glipizide or glyburide -cholestyramine -cholinesterase inhibitors -cyclosporine -digoxin -diuretics -ephedrine -female hormones, like estrogens and birth control pills -isoniazid -ketoconazole -NSAIDS, medicines for pain and inflammation, like ibuprofen or naproxen -phenytoin -rifampin -toxoids -vaccines -warfarin This list may not describe all possible interactions. Give your health care provider a list of all the medicines, herbs, non-prescription drugs, or dietary supplements you use. Also tell them if you smoke, drink alcohol, or use illegal drugs. Some items may interact with your medicine. What should I watch for while using this medicine? Visit your doctor or health care professional for regular checks on your progress. If you are taking this medicine over a prolonged period, carry an identification card with your name and address, the type and dose of your medicine, and your doctor's name and address. This medicine may increase your risk of getting an infection. Tell your doctor or health care professional if you are around anyone with measles or chickenpox, or if you develop sores or blisters that do not heal properly. If you are going to have surgery, tell your doctor or health care professional that you have taken this medicine within the last twelve months. Ask your doctor or health care professional about your diet. You may need to lower the amount of salt you eat. This medicine may affect blood sugar levels. If you have diabetes, check with your doctor or health care professional before you change your diet or the dose of  your diabetic medicine. What side effects may I notice from receiving this medicine? Side effects that you should report to your doctor or health care professional as soon as possible: -allergic reactions like skin rash, itching or hives, swelling of the face, lips, or tongue -changes in emotions or moods -changes in vision -depressed mood -eye pain -fever or chills, cough, sore throat, pain or difficulty passing urine -increased thirst -swelling of ankles, feet Side effects that usually do not require medical attention (report to your doctor or health care professional if they continue or are bothersome): -confusion, excitement, restlessness -headache -nausea, vomiting -skin problems, acne, thin and shiny skin -trouble sleeping -weight gain This list may not describe all possible side effects. Call your doctor for medical advice about side effects. You may report side effects to FDA at 1-800-FDA-1088. Where should I keep my medicine? Keep out of the reach of children. Store at room temperature between 15 and 30 degrees C (59 and 86 degrees F). Protect from light. Keep container tightly closed. Throw away any unused medicine after the expiration date. NOTE: This sheet is a summary. It may not cover all possible information. If you have questions about this medicine, talk to your doctor, pharmacist, or health care provider.  2015, [employer]. (2010-09-03 10:57:14)

## 2013-12-04 NOTE — ED Provider Notes (Signed)
CSN: [id number]     Arrival date & time 12/04/13  0227 History   First MD Initiated Contact with Patient 12/04/13 0251     Chief Complaint  Patient presents with  . Pruritis     (Consider location/radiation/quality/duration/timing/severity/associated sxs/prior Treatment) The history is provided by the patient.  43 -year-old female had onset about 1 hour ago of generalized itching. She denies any difficulty breathing or swallowing. She has not had any new exposures that may precipitated. She has not treated it at home. She has not noticed any rash other than 2 bumps on her forearms.  Past Medical History  Diagnosis Date  . Hypertension   . Acid reflux   . Panic attacks    History reviewed. No pertinent past surgical history. History reviewed. No pertinent family history. History  Substance Use Topics  . Smoking status: Current Every Day Smoker    Types: Cigarettes  . Smokeless tobacco: Not on file  . Alcohol Use: No   OB History    No data available     Review of Systems  All other systems reviewed and are negative.     Allergies  Aspirin  Home Medications   Prior to Admission medications   Not on File   BP 141/97 mmHg  Pulse 94  Temp(Src) 97.7 F (36.5 C) (Oral)  Resp 20  Ht 5\' 3"  (1.6 m)  Wt 106 lb (48.081 kg)  BMI 18.78 kg/m2  SpO2 100% Physical Exam  Nursing note and vitals reviewed.  43 year old female, resting comfortably and in no acute distress. Vital signs are significant for hypertension. Oxygen saturation is 100%, which is normal. Head is normocephalic and atraumatic. PERRLA, EOMI. Oropharynx is clear. Neck is nontender and supple without adenopathy or JVD. Back is nontender and there is no CVA tenderness. Lungs are clear without rales, wheezes, or rhonchi. Chest is nontender. Heart has regular rate and rhythm without murmur. Abdomen is soft, flat, nontender without masses or hepatosplenomegaly and peristalsis is normoactive. Extremities  have no cyanosis or edema, full range of motion is present. Skin has areas of erythema where she has recently scratched, but no rash. Neurologic: Mental status is normal, cranial nerves are intact, there are no motor or sensory deficits.  ED Course  Procedures (including critical care time)  MDM   Final diagnoses:  Pruritus    Pruritus of uncertain cause. No evidence of urticaria. She will be given a dose of diphenhydramine and prednisone. I anticipate sending her home with a short course of prednisone and antihistamines.  She had significant relief with diphenhydramine and prednisone. She is discharged with prescription for prednisone and advised to use over-the-counter Claritin or Zyrtec as well as diphenhydramine.  Dione Boozeavid Azizah Lisle, MD 12/04/13 [phone]

## 2013-12-04 NOTE — ED Notes (Signed)
Pt with bilateral leg pain and lower back pain, denies injury

## 2013-12-04 NOTE — ED Provider Notes (Signed)
CSN: [id number]     Arrival date & time 12/04/13  2015 History  This chart was scribe for Flint MelterElliott L Addilynn Mowrer, MD by Angelene GiovanniEmmanuella Mensah, ED Scribe. The patient was seen in room APA08/APA08 and the patient's care was started at 10:51 PM.   Chief Complaint  Patient presents with  . Leg Pain   The history is provided by the patient. No language interpreter was used.   HPI Comments: Romie Leveeammy I Basilia JumboFarkas is a 43 y.o. female who presents to the Emergency Department complaining of bilateral leg pain  and lower back pain onset today. She states that she has had similar symptoms in the past but has never felt this intense pain before. She denies being in an accident or being injured. She states that she has had an epidural and she was pushed but she does not believe it is related to her current pain. She reports smoking. No alleviating factors noted.   Past Medical History  Diagnosis Date  . Hypertension   . Acid reflux   . Panic attacks    Past Surgical History  Procedure Laterality Date  . Partial hysterectomy     History reviewed. No pertinent family history. History  Substance Use Topics  . Smoking status: Current Every Day Smoker    Types: Cigarettes  . Smokeless tobacco: Not on file  . Alcohol Use: No   OB History    No data available     Review of Systems  Constitutional: Negative for fever.  Musculoskeletal: Positive for back pain.       Leg pain      Allergies  Aspirin  Home Medications   Prior to Admission medications   Medication Sig Start Date End Date Taking? Authorizing Provider  omeprazole (PRILOSEC) 20 MG capsule Take 20 mg by mouth daily. 11/09/13  Yes Historical Provider, MD  predniSONE (DELTASONE) 50 MG tablet Take 1 tablet (50 mg total) by mouth daily. 12/04/13  Yes Dione Boozeavid Glick, MD  ramipril (ALTACE) 2.5 MG capsule Take 2.5 mg by mouth daily. 11/09/13  Yes Historical Provider, MD   BP 136/87 mmHg  Pulse 109  Temp(Src) 98.2 F (36.8 C)  Ht 5\' 3"  (1.6 m)  Wt 106 lb  (48.081 kg)  BMI 18.78 kg/m2  SpO2 100% Physical Exam  Constitutional: She is oriented to person, place, and time. She appears well-developed and well-nourished.  HENT:  Head: Normocephalic and atraumatic.  Eyes: Conjunctivae and EOM are normal. Pupils are equal, round, and reactive to light.  Neck: Normal range of motion and phonation normal. Neck supple.  Cardiovascular: Normal rate and regular rhythm.   Pulmonary/Chest: Effort normal and breath sounds normal. She exhibits no tenderness.  Abdominal: Soft. She exhibits no distension. There is no tenderness. There is no guarding.  Musculoskeletal: Normal range of motion. She exhibits tenderness.  Bilateral lumbar tenderness to palpation. Evette CristalShin is tender to palpation without deformity. ROM in both legs  Neurological: She is alert and oriented to person, place, and time. She exhibits normal muscle tone.  Skin: Skin is warm and dry.  Psychiatric: She has a normal mood and affect. Her behavior is normal. Judgment and thought content normal.  Nursing note and vitals reviewed.   ED Course  Procedures (including critical care time) Medications  ketorolac (TORADOL) injection 60 mg (60 mg Intramuscular Given 12/04/13 2308)   Patient Vitals for the past 24 hrs:  BP Temp Pulse SpO2 Height Weight  12/04/13 2028 136/87 mmHg 98.2 F (36.8 C) 109 100 %  5\' 3"  (1.6 m) 106 lb (48.081 kg)   10:55 PM- Pt advised of plan for treatment and pt agrees.  Labs Review Labs Reviewed - No data to display  Imaging Review No results found.   EKG Interpretation None      MDM   Final diagnoses:  Bilateral low back pain without sciatica  Pain of left lower extremity    History musculoskeletal pain, with reassuring evaluation. Doubt cauda equina syndrome, serious bacterial infection or metabolic instability.  I personally performed the services described in this documentation, which was scribed in my presence. The recorded information has been  reviewed and is accurate.    Flint MelterElliott L Jazel Nimmons, MD 12/05/13 [phone]

## 2013-12-04 NOTE — Discharge Instructions (Signed)
Use heat on the sore area 3 or 4 times a day. Take ibuprofen 400 mg 3 times a day for pain.   Back Pain, Adult Low back pain is very common. About 1 in 5 people have back pain.The cause of low back pain is rarely dangerous. The pain often gets better over time.About half of people with a sudden onset of back pain feel better in just 2 weeks. About 8 in 10 people feel better by 6 weeks.  CAUSES Some common causes of back pain include:  Strain of the muscles or ligaments supporting the spine.  Wear and tear (degeneration) of the spinal discs.  Arthritis.  Direct injury to the back. DIAGNOSIS Most of the time, the direct cause of low back pain is not known.However, back pain can be treated effectively even when the exact cause of the pain is unknown.Answering your caregiver's questions about your overall health and symptoms is one of the most accurate ways to make sure the cause of your pain is not dangerous. If your caregiver needs more information, he or she may order lab work or imaging tests (X-rays or MRIs).However, even if imaging tests show changes in your back, this usually does not require surgery. HOME CARE INSTRUCTIONS For many people, back pain returns.Since low back pain is rarely dangerous, it is often a condition that people can learn to [facility] their own.   Remain active. It is stressful on the back to sit or stand in one place. Do not sit, drive, or stand in one place for more than 30 minutes at a time. Take short walks on level surfaces as soon as pain allows.Try to increase the length of time you walk each day.  Do not stay in bed.Resting more than 1 or 2 days can delay your recovery.  Do not avoid exercise or work.Your body is made to move.It is not dangerous to be active, even though your back may hurt.Your back will likely heal faster if you return to being active before your pain is gone.  Pay attention to your body when you bend and lift. Many people have  less discomfortwhen lifting if they bend their knees, keep the load close to their bodies,and avoid twisting. Often, the most comfortable positions are those that put less stress on your recovering back.  Find a comfortable position to sleep. Use a firm mattress and lie on your side with your knees slightly bent. If you lie on your back, put a pillow under your knees.  Only take over-the-counter or prescription medicines as directed by your caregiver. Over-the-counter medicines to reduce pain and inflammation are often the most helpful.Your caregiver may prescribe muscle relaxant drugs.These medicines help dull your pain so you can more quickly return to your normal activities and healthy exercise.  Put ice on the injured area.  Put ice in a plastic bag.  Place a towel between your skin and the bag.  Leave the ice on for 15-20 minutes, 03-04 times a day for the first 2 to 3 days. After that, ice and heat may be alternated to reduce pain and spasms.  Ask your caregiver about trying back exercises and gentle massage. This may be of some benefit.  Avoid feeling anxious or stressed.Stress increases muscle tension and can worsen back pain.It is important to recognize when you are anxious or stressed and learn ways to manage it.Exercise is a great option. SEEK MEDICAL CARE IF:  You have pain that is not relieved with rest or medicine.  You have pain that does not improve in 1 week.  You have new symptoms.  You are generally not feeling well. SEEK IMMEDIATE MEDICAL CARE IF:   You have pain that radiates from your back into your legs.  You develop new bowel or bladder control problems.  You have unusual weakness or numbness in your arms or legs.  You develop nausea or vomiting.  You develop abdominal pain.  You feel faint. Document Released: 01/18/2005 Document Revised: 07/20/2011 Document Reviewed: 05/22/2013 [facility] Patient Information 2015 [place], [place]. This information  is not intended to replace advice given to you by your health care provider. Make sure you discuss any questions you have with your health care provider.  Emergency Department Resource Guide 1) Find a Doctor and Pay Out of Pocket Although you won't have to find out who is covered by your insurance plan, it is a good idea to ask around and get recommendations. You will then need to call the office and see if the doctor you have chosen will accept you as a new patient and what types of options they offer for patients who are self-pay. Some doctors offer discounts or will set up payment plans for their patients who do not have insurance, but you will need to ask so you aren't surprised when you get to your appointment.  2) Contact Your Local Health Department Not all health departments have doctors that can see patients for sick visits, but many do, so it is worth a call to see if yours does. If you don't know where your local health department is, you can check in your phone book. The CDC also has a tool to help you locate your state's health department, and many state websites also have listings of all of their local health departments.  3) Find a Walk-in Clinic If your illness is not likely to be very severe or complicated, you may want to try a walk in clinic. These are popping up all over the country in pharmacies, drugstores, and shopping centers. They're usually staffed by nurse practitioners or physician assistants that have been trained to treat common illnesses and complaints. They're usually fairly quick and inexpensive. However, if you have serious medical issues or chronic medical problems, these are probably not your best option.  No Primary Care Doctor: - Call Health Connect at  [phone] - they can help you locate a primary care doctor that  accepts your insurance, provides certain services, etc. - Physician Referral Service- [phone]  Chronic Pain Problems: Organization          Address  Phone   Notes  [facility]  [phone] Patients need to be referred by their primary care doctor.   Medication Assistance: Organization         Address  Phone   Notes  [facility] Medication [facility] Program [address] [place], [place] [address] [phone] --Must be a resident of [place] -- Must have NO insurance coverage whatsoever (no Medicaid/ Medicare, etc.) -- The pt. MUST have a primary care doctor that directs their care regularly and follows them in the community   MedAssist  [phone]   [employer]  [phone]    Agencies that provide inexpensive medical care: Organization         Address  Phone   Notes  [facility]  [phone]   [facility]    [phone]   Women's  [facility] [address] [place], [place] [address] [phone]   [facility] [address], [place] [phone]   Planned Parenthood    [phone]) [phone]   [facility]    [phone]) [phone]   Community Health and [facility]  [address], [place] Phone:  [phone], Fax:  [phone]) [phone] Hours of Operation:  9 am - 6 pm, M-F.  Also accepts Medicaid/Medicare and self-pay.  [facility]  [address], [place] Phone: [phone], Fax: [phone]) [phone]. Hours of Operation:  8:30 am - 5:30 pm, M-F.  Also accepts Medicaid and self-pay.  [facility] [place] [address], [place] Phone: [phone]   [facility] [address], [place], [place] [phone], Ext. [phone] Mondays & Thursdays: 7-9 AM.  First 15 patients are seen on a first come, first serve basis.    Medicaid-accepting [facility] Providers:  Organization         Address  Phone   Notes  [facility] [address], [place] [phone] Also accepts self-pay patients.  [facility] [address] [place], [place]  [phone]) [phone]   [facility] [address], [place] [phone]) [phone]   [facility] [address] [place] [address], [place] [phone]   Renaye RakersVeita Bland [address], [place]   [phone] Only accepts [place] Access [place] patients after they have their name applied to their card.   Self-Pay (no insurance) in [facility]:  Organization         Address  Phone   Notes  Sickle Cell Patients, [facility] [address], [place] [phone]) [phone]   [facility] [address], [place] [phone]) [phone]   [facility] [place]  [address], [place] [phone]   [facility]/Dr. Osei-Bonsu  [address] [place] [address], [place] or [phone], [place] [phone]) [phone] Phone number for both [place] and [place] locations is the same.  [facility] [address], [place] [phone]   [facility] [address] [place] [address], [place] or [address] [phone]   [facility] [address], [place] [phone], phone; [phone], fax Sees patients 1st and 3rd Saturday of every month.  Must not qualify for public or private insurance (i.e. Medicaid, Medicare, [place] Health Choice, Veterans' Benefits)  Household income should be no more than 200% of the poverty level The clinic cannot treat you if you are pregnant or think you are pregnant  Sexually transmitted diseases are not treated at the clinic.    Dental Care: Organization         Address  Phone  Notes  [facility] Department of [facility] [address], [place] [phone] Accepts children up to age 43 who are enrolled in [place] or [place] Health Choice; pregnant women with a Medicaid card; and  children who have applied for Medicaid or  Health Choice, but were declined, whose parents can pay a reduced fee at time of service.  [facility] Department of [facility] [place]  [address], [place] [phone] Accepts children up to age 43 who are  enrolled in Medicaid or [place] Health Choice; pregnant women with a Medicaid card; and children who have applied for Medicaid or [place] Health Choice, but were declined, whose parents can pay a reduced fee at time of service.  [facility]  [address] [place], [place] [phone] Patients are seen by appointment only. Walk-ins are not accepted. [facility] will see patients 37 years of age and older. Monday - Tuesday (8am-5pm) Most Wednesdays (8:30-5pm) $30 per visit, cash only  [facility] Dental Access PROGRAM  [address], [facility] [phone] Patients are seen by appointment only. Walk-ins are not accepted. [facility] will see patients 20 years of age and older. One Wednesday Evening (Monthly: Volunteer Based).  $30 per visit, cash only  [employer]  [phone] for adults; Children under age 20, call Graduate Pediatric Dentistry at [phone]. Children aged 39-14, please call [phone] to request a pediatric application.  Dental services are provided in all areas of dental care including fillings, crowns and bridges, complete and partial dentures, implants, gum treatment, root canals, and extractions. Preventive care is also provided. Treatment is provided to both adults and children. Patients are selected via a lottery and there is often a waiting list.   [facility] [address], [place]  [phone] [email or web address]   [facility] [address] [place], [place] [phone], Ext. [phone] Second and Fourth Thursday of each month, opens at 6:30 AM; Clinic ends at 9 AM.  Patients are seen on a first-come first-served  basis, and a limited number are seen during each clinic.   [facility]  [address] [place] [place], [place] [phone]   Eligibility Requirements You must have lived in [place], [place], or [place] counties for at least the last three months.   You cannot be eligible for state or federal sponsored [place], including [employer], [place], or [employer].   You generally cannot be eligible for healthcare insurance through your employer.    How to apply: Eligibility screenings are held every Tuesday and Wednesday afternoon from 1:00 pm until 4:00 pm. You do not need an appointment for the interview!  [facility] [address], [place], [place] [phone]   [facility] Health Department  [phone]   [facility] Health Department  [phone]   [facility] Health Department  [phone]    Behavioral Health Resources in the Community: Intensive Outpatient Programs Organization         Address  Phone  Notes  [facility] Services [address], [place], [place] [phone]   [facility] Outpatient [address] [place], [place] [phone]   ADS: Alcohol & Drug Svcs [address] [place], [place]  [phone]   [facility] [address]  [place], [place] [phone] or [phone]   Substance Abuse Resources Organization         Address  Phone  Notes  Alcohol and Drug Services  [phone]   [facility]  [phone]   [facility]  [phone]   Floydene Flock  [phone]   Residential & Outpatient Substance Abuse Program  [phone]   Psychological Services Organization         Address  Phone  Notes  [facility]  [phone]   [facility] Services  [phone]   [facility] [address], [place] [phone]  or [phone]    Mobile Crisis Teams Organization          Address  Phone  Notes  [facility], Mobile Crisis Care Unit  [phone]   [facility]  [address] [place], [place] [phone]   [facility] [address] [place] [phone]    Self-Help/Support Groups Organization         Address  Phone             Notes  [facility]oc[facility] of [place] - variety of support groups  336- I7437963 Call for more information  Narcotics Anonymous (NA), [facility] [address], [employer] [place]  2 meetings at this location   Statistician         Address  Phone  Notes  [facility] [address],    [place]  [phone]   [facility]  [address] [place] 7[address]366, [place], [place] [phone]   [facility] [address] [place], [place] [phone] Admissions: 8am-3pm M-F  [facility] [address],    [place], [place] [phone]   [facility] [address] [place], [place], [place] [phone]   [facility] [address]  [place], [place] [phone]   [facility] - Intensive Outpatient [address], [place], [place] [phone]   [facility] [address]  [place], [place] [phone] or [phone]   [facility] ([facility]) [address], [place], [place] [phone] Accepts Medicaid  [facility] [place] [address]  [place] [phone] Substance Abuse/Addiction Treatment   [facility] Organization         Address  Phone  Notes  [facility]  [phone]   Angie Fava, PhD [address] [place], [place]   [phone] or [phone]   [facility]   [address] [place], [place] [phone]   [facility] [address], [place], [place] [phone] Insurance/Medicaid/sponsorship  through [facility] and Families [address]                                    [place], [place] [phone] Therapy/tele-psych/case  [facility] [address] [place], [place] [phone]    Dr. Lolly Mustache  [phone]   [facility]  [employer] [facility] 1) [address] [place] 2) [address], [place] 3)  [address], [place] [phone]  [phone]   [facility] Child Abuse Hotline 636-627-8539 or [phone] (After Hours)

## 2013-12-04 NOTE — ED Notes (Signed)
Pt c/o itching all over that started x pta

## 2014-02-07 ENCOUNTER — Encounter (HOSPITAL_COMMUNITY): Payer: Self-pay | Admitting: *Deleted

## 2014-02-07 ENCOUNTER — Emergency Department (HOSPITAL_COMMUNITY)
Admission: EM | Admit: 2014-02-07 | Discharge: 2014-02-07 | Disposition: A | Discharge status: Home / Self Care | Payer: Medicare Other | Attending: Emergency Medicine | Admitting: Emergency Medicine

## 2014-02-07 DIAGNOSIS — Z79899 Other long term (current) drug therapy: Secondary | ICD-10-CM | POA: Insufficient documentation

## 2014-02-07 DIAGNOSIS — J029 Acute pharyngitis, unspecified: Secondary | ICD-10-CM | POA: Diagnosis present

## 2014-02-07 DIAGNOSIS — J069 Acute upper respiratory infection, unspecified: Secondary | ICD-10-CM | POA: Diagnosis not present

## 2014-02-07 DIAGNOSIS — Z8659 Personal history of other mental and behavioral disorders: Secondary | ICD-10-CM | POA: Insufficient documentation

## 2014-02-07 DIAGNOSIS — K219 Gastro-esophageal reflux disease without esophagitis: Secondary | ICD-10-CM | POA: Diagnosis not present

## 2014-02-07 DIAGNOSIS — I10 Essential (primary) hypertension: Secondary | ICD-10-CM | POA: Diagnosis not present

## 2014-02-07 DIAGNOSIS — Z720 Tobacco use: Secondary | ICD-10-CM | POA: Diagnosis not present

## 2014-02-07 MED ORDER — ACETAMINOPHEN 325 MG PO TABS
650.0000 mg | ORAL_TABLET | Freq: Once | ORAL | Status: AC
  Administered 2014-02-07: 650 mg via ORAL
  Filled 2014-02-07: qty 2

## 2014-02-07 MED ORDER — AMOXICILLIN 500 MG PO CAPS: 500.0000 mg | ORAL_CAPSULE | Freq: Three times a day (TID) | ORAL | Status: DC

## 2014-02-07 MED ORDER — AMOXICILLIN 250 MG PO CAPS
500.0000 mg | ORAL_CAPSULE | Freq: Once | ORAL | Status: AC
  Administered 2014-02-07: 500 mg via ORAL
  Filled 2014-02-07: qty 2

## 2014-02-07 NOTE — ED Notes (Signed)
Sore throat, bil ear pain since Monday. Hoarse voice

## 2014-02-07 NOTE — ED Provider Notes (Signed)
CSN: [id number]     Arrival date & time 02/07/14  1217 History   First MD Initiated Contact with Patient 02/07/14 1447     Chief Complaint  Patient presents with  . Sore Throat     (Consider location/radiation/quality/duration/timing/severity/associated sxs/prior Treatment) Patient is a 44 y.o. female presenting with pharyngitis. The history is provided by the patient.  Sore Throat This is a new problem. The current episode started in the past 7 days. The problem occurs intermittently. The problem has been gradually worsening. Associated symptoms include a sore throat. Pertinent negatives include no abdominal pain, arthralgias, chest pain, coughing or neck pain. Associated symptoms comments: Hoarse voice . The symptoms are aggravated by swallowing. She has tried acetaminophen for the symptoms. The treatment provided no relief.    Past Medical History  Diagnosis Date  . Hypertension   . Acid reflux   . Panic attacks    Past Surgical History  Procedure Laterality Date  . Partial hysterectomy    . Abdominal hysterectomy     History reviewed. No pertinent family history. History  Substance Use Topics  . Smoking status: Current Every Day Smoker    Types: Cigarettes  . Smokeless tobacco: Not on file  . Alcohol Use: No   OB History    No data available     Review of Systems  Constitutional: Negative for activity change.       All ROS Neg except as noted in HPI  HENT: Positive for sore throat.   Eyes: Negative for photophobia and discharge.  Respiratory: Negative for cough, shortness of breath and wheezing.   Cardiovascular: Negative for chest pain and palpitations.  Gastrointestinal: Negative for abdominal pain and blood in stool.  Genitourinary: Negative for dysuria, frequency and hematuria.  Musculoskeletal: Negative for back pain, arthralgias and neck pain.  Skin: Negative.   Neurological: Negative for dizziness, seizures and speech difficulty.  Psychiatric/Behavioral:  Negative for hallucinations and confusion.      Allergies  Aspirin  Home Medications   Prior to Admission medications   Medication Sig Start Date End Date Taking? Authorizing Provider  hydrOXYzine (ATARAX/VISTARIL) 25 MG tablet Take 25 mg by mouth at bedtime. 12/05/13  Yes Historical Provider, MD  omeprazole (PRILOSEC) 20 MG capsule Take 20 mg by mouth daily. 11/09/13  Yes Historical Provider, MD  ramipril (ALTACE) 2.5 MG capsule Take 2.5 mg by mouth daily. 11/09/13  Yes Historical Provider, MD  cyclobenzaprine (FLEXERIL) 10 MG tablet Take 10 mg by mouth 3 (three) times daily as needed for muscle spasms.  12/05/13   Historical Provider, MD  predniSONE (DELTASONE) 50 MG tablet Take 1 tablet (50 mg total) by mouth daily. Patient not taking: Reported on 02/07/2014 12/04/13   Dione Boozeavid Glick, MD   BP 151/89 mmHg  Pulse 94  Temp(Src) 97.7 F (36.5 C) (Oral)  Resp 20  Ht 5\' 3"  (1.6 m)  Wt 106 lb (48.081 kg)  BMI 18.78 kg/m2  SpO2 100% Physical Exam  Constitutional: She is oriented to person, place, and time. She appears well-developed and well-nourished.  Non-toxic appearance.  HENT:  Head: Normocephalic.  Right Ear: Tympanic membrane and external ear normal.  Left Ear: Tympanic membrane and external ear normal.  Mouth/Throat: Uvula is midline. Uvula swelling present. Posterior oropharyngeal erythema present.  Nasal congestion  Eyes: EOM and lids are normal. Pupils are equal, round, and reactive to light.  Neck: Normal range of motion. Neck supple. Carotid bruit is not present.  Cardiovascular: Normal rate, regular rhythm, normal  heart sounds, intact distal pulses and normal pulses.   Pulmonary/Chest: Breath sounds normal. No respiratory distress.  Abdominal: Soft. Bowel sounds are normal. There is no tenderness. There is no guarding.  Musculoskeletal: Normal range of motion.  Lymphadenopathy:       Head (right side): No submandibular adenopathy present.       Head (left side): No  submandibular adenopathy present.    She has no cervical adenopathy.  Neurological: She is alert and oriented to person, place, and time. She has normal strength. No cranial nerve deficit or sensory deficit.  Skin: Skin is warm and dry.  Psychiatric: She has a normal mood and affect. Her speech is normal.  Nursing note and vitals reviewed.   ED Course  Procedures (including critical care time) Labs Review Labs Reviewed - No data to display  Imaging Review No results found.   EKG Interpretation None      MDM  Exam is consistent with pharyngitis and uri. Pt to increase fluids and wash hands frequently. Rx for amoxil and ibuprofen given to the patient.   Final diagnoses:  None    *I have reviewed nursing notes, vital signs, and all appropriate lab and imaging results for this patient.**    Kathie Dike, PA-C 02/07/14 1505  Layla Maw Ward, DO 02/07/14 1523

## 2014-02-07 NOTE — Discharge Instructions (Signed)
Please use salt water gargles several times daily. Use amoxil three times daily. Use tylenol for soreness. Wash hands frequently. Pharyngitis Pharyngitis is redness, pain, and swelling (inflammation) of your pharynx.  CAUSES  Pharyngitis is usually caused by infection. Most of the time, these infections are from viruses (viral) and are part of a cold. However, sometimes pharyngitis is caused by bacteria (bacterial). Pharyngitis can also be caused by allergies. Viral pharyngitis may be spread from person to person by coughing, sneezing, and personal items or utensils (cups, forks, spoons, toothbrushes). Bacterial pharyngitis may be spread from person to person by more intimate contact, such as kissing.  SIGNS AND SYMPTOMS  Symptoms of pharyngitis include:   Sore throat.   Tiredness (fatigue).   Low-grade fever.   Headache.  Joint pain and muscle aches.  Skin rashes.  Swollen lymph nodes.  Plaque-like film on throat or tonsils (often seen with bacterial pharyngitis). DIAGNOSIS  Your health care provider will ask you questions about your illness and your symptoms. Your medical history, along with a physical exam, is often all that is needed to diagnose pharyngitis. Sometimes, a rapid strep test is done. Other lab tests may also be done, depending on the suspected cause.  TREATMENT  Viral pharyngitis will usually get better in 3-4 days without the use of medicine. Bacterial pharyngitis is treated with medicines that kill germs (antibiotics).  HOME CARE INSTRUCTIONS   Drink enough water and fluids to keep your urine clear or pale yellow.   Only take over-the-counter or prescription medicines as directed by your health care provider:   If you are prescribed antibiotics, make sure you finish them even if you start to feel better.   Do not take aspirin.   Get lots of rest.   Gargle with 8 oz of salt water ( tsp of salt per 1 qt of water) as often as every 1-2 hours to  soothe your throat.   Throat lozenges (if you are not at risk for choking) or sprays may be used to soothe your throat. SEEK MEDICAL CARE IF:   You have large, tender lumps in your neck.  You have a rash.  You cough up green, yellow-brown, or bloody spit. SEEK IMMEDIATE MEDICAL CARE IF:   Your neck becomes stiff.  You drool or are unable to swallow liquids.  You vomit or are unable to keep medicines or liquids down.  You have severe pain that does not go away with the use of recommended medicines.  You have trouble breathing (not caused by a stuffy nose). MAKE SURE YOU:   Understand these instructions.  Will watch your condition.  Will get help right away if you are not doing well or get worse. Document Released: 01/18/2005 Document Revised: 11/08/2012 Document Reviewed: 09/25/2012 [facility] Patient Information 2015 [place], [place]. This information is not intended to replace advice given to you by your health care provider. Make sure you discuss any questions you have with your health care provider.

## 2014-03-24 ENCOUNTER — Encounter (HOSPITAL_COMMUNITY): Payer: Self-pay | Admitting: *Deleted

## 2014-03-24 ENCOUNTER — Emergency Department (HOSPITAL_COMMUNITY)
Admission: EM | Admit: 2014-03-24 | Discharge: 2014-03-24 | Disposition: A | Discharge status: Home / Self Care | Payer: Medicare Other | Attending: Emergency Medicine | Admitting: Emergency Medicine

## 2014-03-24 ENCOUNTER — Emergency Department (HOSPITAL_COMMUNITY): Payer: Medicare Other

## 2014-03-24 DIAGNOSIS — Z79899 Other long term (current) drug therapy: Secondary | ICD-10-CM | POA: Diagnosis not present

## 2014-03-24 DIAGNOSIS — K219 Gastro-esophageal reflux disease without esophagitis: Secondary | ICD-10-CM | POA: Insufficient documentation

## 2014-03-24 DIAGNOSIS — R000 Tachycardia, unspecified: Secondary | ICD-10-CM | POA: Diagnosis not present

## 2014-03-24 DIAGNOSIS — K5900 Constipation, unspecified: Secondary | ICD-10-CM | POA: Diagnosis not present

## 2014-03-24 DIAGNOSIS — I10 Essential (primary) hypertension: Secondary | ICD-10-CM | POA: Diagnosis not present

## 2014-03-24 DIAGNOSIS — Z7952 Long term (current) use of systemic steroids: Secondary | ICD-10-CM | POA: Insufficient documentation

## 2014-03-24 DIAGNOSIS — Z720 Tobacco use: Secondary | ICD-10-CM | POA: Insufficient documentation

## 2014-03-24 DIAGNOSIS — R1084 Generalized abdominal pain: Secondary | ICD-10-CM

## 2014-03-24 DIAGNOSIS — R112 Nausea with vomiting, unspecified: Secondary | ICD-10-CM

## 2014-03-24 DIAGNOSIS — R109 Unspecified abdominal pain: Secondary | ICD-10-CM

## 2014-03-24 DIAGNOSIS — F410 Panic disorder [episodic paroxysmal anxiety] without agoraphobia: Secondary | ICD-10-CM | POA: Insufficient documentation

## 2014-03-24 LAB — CBC WITH DIFFERENTIAL/PLATELET
Basophils Absolute: 0 10*3/uL (ref 0.0–0.1)
Basophils Relative: 0 % (ref 0–1)
Eosinophils Absolute: 0.1 10*3/uL (ref 0.0–0.7)
Eosinophils Relative: 1 % (ref 0–5)
HEMATOCRIT: 44.4 % (ref 36.0–46.0)
Hemoglobin: 15.3 g/dL — ABNORMAL HIGH (ref 12.0–15.0)
Lymphocytes Relative: 2 % — ABNORMAL LOW (ref 12–46)
Lymphs Abs: 0.4 10*3/uL — ABNORMAL LOW (ref 0.7–4.0)
MCH: 29.3 pg (ref 26.0–34.0)
MCHC: 34.5 g/dL (ref 30.0–36.0)
MCV: 84.9 fL (ref 78.0–100.0)
MONOS PCT: 3 % (ref 3–12)
Monocytes Absolute: 0.6 10*3/uL (ref 0.1–1.0)
NEUTROS ABS: 16.5 10*3/uL — AB (ref 1.7–7.7)
NEUTROS PCT: 94 % — AB (ref 43–77)
PLATELETS: 266 10*3/uL (ref 150–400)
RBC: 5.23 MIL/uL — AB (ref 3.87–5.11)
RDW: 13.9 % (ref 11.5–15.5)
WBC: 17.6 10*3/uL — ABNORMAL HIGH (ref 4.0–10.5)

## 2014-03-24 LAB — COMPREHENSIVE METABOLIC PANEL
ALT: 34 U/L (ref 0–35)
AST: 22 U/L (ref 0–37)
Albumin: 4.8 g/dL (ref 3.5–5.2)
Alkaline Phosphatase: 82 U/L (ref 39–117)
Anion gap: 3 — ABNORMAL LOW (ref 5–15)
BILIRUBIN TOTAL: 0.9 mg/dL (ref 0.3–1.2)
BUN: 21 mg/dL (ref 6–23)
CHLORIDE: 106 mmol/L (ref 96–112)
CO2: 26 mmol/L (ref 19–32)
Calcium: 9.3 mg/dL (ref 8.4–10.5)
Creatinine, Ser: 0.58 mg/dL (ref 0.50–1.10)
GFR calc Af Amer: >90 mL/min (ref >90)
GFR calc non Af Amer: >90 mL/min (ref >90)
Glucose, Bld: 127 mg/dL — ABNORMAL HIGH (ref 70–99)
Potassium: 3.8 mmol/L (ref 3.5–5.1)
SODIUM: 135 mmol/L (ref 135–145)
Total Protein: 8.3 g/dL (ref 6.0–8.3)

## 2014-03-24 LAB — URINALYSIS, ROUTINE W REFLEX MICROSCOPIC
Glucose, UA: NEGATIVE mg/dL
HGB URINE DIPSTICK: NEGATIVE
NITRITE: NEGATIVE
Protein, ur: NEGATIVE mg/dL
Specific Gravity, Urine: 1.02 (ref 1.005–1.030)
Urobilinogen, UA: 1 mg/dL (ref 0.0–1.0)
pH: 6.5 (ref 5.0–8.0)

## 2014-03-24 LAB — LIPASE, BLOOD: LIPASE: 25 U/L (ref 11–59)

## 2014-03-24 LAB — URINE MICROSCOPIC-ADD ON

## 2014-03-24 LAB — I-STAT CG4 LACTIC ACID, ED: Lactic Acid, Venous: 0.72 mmol/L (ref 0.5–2.0)

## 2014-03-24 LAB — I-STAT TROPONIN, ED: TROPONIN I, POC: 0.01 ng/mL (ref 0.00–0.08)

## 2014-03-24 MED ORDER — SODIUM CHLORIDE 0.9 % IV SOLN
1000.0000 mL | Freq: Once | INTRAVENOUS | Status: AC
  Administered 2014-03-24: 1000 mL via INTRAVENOUS

## 2014-03-24 MED ORDER — LORAZEPAM 2 MG/ML IJ SOLN
1.0000 mg | Freq: Once | INTRAMUSCULAR | Status: AC
  Administered 2014-03-24: 1 mg via INTRAVENOUS
  Filled 2014-03-24: qty 1

## 2014-03-24 MED ORDER — ACETAMINOPHEN 325 MG PO TABS
650.0000 mg | ORAL_TABLET | Freq: Once | ORAL | Status: AC
  Administered 2014-03-24: 650 mg via ORAL

## 2014-03-24 MED ORDER — ONDANSETRON HCL 4 MG/2ML IJ SOLN
4.0000 mg | Freq: Once | INTRAMUSCULAR | Status: AC
  Administered 2014-03-24: 4 mg via INTRAVENOUS

## 2014-03-24 MED ORDER — ONDANSETRON HCL 4 MG/2ML IJ SOLN
4.0000 mg | Freq: Once | INTRAMUSCULAR | Status: DC
  Filled 2014-03-24: qty 2

## 2014-03-24 MED ORDER — ONDANSETRON HCL 4 MG PO TABS: 4.0000 mg | ORAL_TABLET | Freq: Four times a day (QID) | ORAL | Status: DC

## 2014-03-24 MED ORDER — IOHEXOL 300 MG/ML  SOLN
50.0000 mL | Freq: Once | INTRAMUSCULAR | Status: AC | PRN
  Administered 2014-03-24: 50 mL via ORAL

## 2014-03-24 MED ORDER — METOPROLOL TARTRATE 1 MG/ML IV SOLN
5.0000 mg | Freq: Once | INTRAVENOUS | Status: AC
  Administered 2014-03-24: 5 mg via INTRAVENOUS
  Filled 2014-03-24: qty 5

## 2014-03-24 MED ORDER — LACTULOSE 20 GM/30ML PO SOLN: 20.0000 g | Freq: Two times a day (BID) | ORAL | Status: DC | PRN

## 2014-03-24 MED ORDER — IOHEXOL 300 MG/ML  SOLN
100.0000 mL | Freq: Once | INTRAMUSCULAR | Status: AC | PRN
  Administered 2014-03-24: 100 mL via INTRAVENOUS

## 2014-03-24 MED ORDER — ACETAMINOPHEN 325 MG PO TABS
ORAL_TABLET | ORAL | Status: AC
  Administered 2014-03-24: 650 mg via ORAL
  Filled 2014-03-24: qty 2

## 2014-03-24 NOTE — ED Notes (Signed)
Dr. Blinda LeatherwoodPollina made aware of cont increase in HR.  Order recv'd for additional IVF.

## 2014-03-24 NOTE — Discharge Instructions (Signed)
Abdominal Pain, Women °Abdominal (stomach, pelvic, or belly) pain can be caused by many things. It is important to tell your doctor: °· The location of the pain. °· Does it come and go or is it present all the time? °· Are there things that start the pain (eating certain foods, exercise)? °· Are there other symptoms associated with the pain (fever, nausea, vomiting, diarrhea)? °All of this is helpful to know when trying to find the cause of the pain. °CAUSES  °· Stomach: virus or bacteria infection, or ulcer. °· Intestine: appendicitis (inflamed appendix), regional ileitis (Crohn's disease), ulcerative colitis (inflamed colon), irritable bowel syndrome, diverticulitis (inflamed diverticulum of the colon), or cancer of the stomach or intestine. °· Gallbladder disease or stones in the gallbladder. °· Kidney disease, kidney stones, or infection. °· Pancreas infection or cancer. °· Fibromyalgia (pain disorder). °· Diseases of the female organs: °· Uterus: fibroid (non-cancerous) tumors or infection. °· Fallopian tubes: infection or tubal pregnancy. °· Ovary: cysts or tumors. °· Pelvic adhesions (scar tissue). °· Endometriosis (uterus lining tissue growing in the pelvis and on the pelvic organs). °· Pelvic congestion syndrome (female organs filling up with blood just before the menstrual period). °· Pain with the menstrual period. °· Pain with ovulation (producing an egg). °· Pain with an IUD (intrauterine device, birth control) in the uterus. °· Cancer of the female organs. °· Functional pain (pain not caused by a disease, may improve without treatment). °· Psychological pain. °· Depression. °DIAGNOSIS  °Your doctor will decide the seriousness of your pain by doing an examination. °· Blood tests. °· X-rays. °· Ultrasound. °· CT scan (computed tomography, special type of X-ray). °· MRI (magnetic resonance imaging). °· Cultures, for infection. °· Barium enema (dye inserted in the large intestine, to better view it with  X-rays). °· Colonoscopy (looking in intestine with a lighted tube). °· Laparoscopy (minor surgery, looking in abdomen with a lighted tube). °· Major abdominal exploratory surgery (looking in abdomen with a large incision). °TREATMENT  °The treatment will depend on the cause of the pain.  °· Many cases can be observed and treated at home. °· Over-the-counter medicines recommended by your caregiver. °· Prescription medicine. °· Antibiotics, for infection. °· Birth control pills, for painful periods or for ovulation pain. °· Hormone treatment, for endometriosis. °· Nerve blocking injections. °· Physical therapy. °· Antidepressants. °· Counseling with a psychologist or psychiatrist. °· Minor or major surgery. °HOME CARE INSTRUCTIONS  °· Do not take laxatives, unless directed by your caregiver. °· Take over-the-counter pain medicine only if ordered by your caregiver. Do not take aspirin because it can cause an upset stomach or bleeding. °· Try a clear liquid diet (broth or water) as ordered by your caregiver. Slowly move to a bland diet, as tolerated, if the pain is related to the stomach or intestine. °· Have a thermometer and take your temperature several times a day, and record it. °· Bed rest and sleep, if it helps the pain. °· Avoid sexual intercourse, if it causes pain. °· Avoid stressful situations. °· Keep your follow-up appointments and tests, as your caregiver orders. °· If the pain does not go away with medicine or surgery, you may try: °· Acupuncture. °· Relaxation exercises (yoga, meditation). °· Group therapy. °· Counseling. °SEEK MEDICAL CARE IF:  °· You notice certain foods cause stomach pain. °· Your home care treatment is not helping your pain. °· You need stronger pain medicine. °· You want your IUD removed. °· You feel faint or   lightheaded. °· You develop nausea and vomiting. °· You develop a rash. °· You are having side effects or an allergy to your medicine. °SEEK IMMEDIATE MEDICAL CARE IF:  °· Your  pain does not go away or gets worse. °· You have a fever. °· Your pain is felt only in portions of the abdomen. The right side could possibly be appendicitis. The left lower portion of the abdomen could be colitis or diverticulitis. °· You are passing blood in your stools (bright red or black tarry stools, with or without vomiting). °· You have blood in your urine. °· You develop chills, with or without a fever. °· You pass out. °MAKE SURE YOU:  °· Understand these instructions. °· Will watch your condition. °· Will get help right away if you are not doing well or get worse. °Document Released: 11/15/2006 Document Revised: 06/04/2013 Document Reviewed: 12/05/2008 °[employer]® Patient Information ©2015 [employer]. This information is not intended to replace advice given to you by your health care provider. Make sure you discuss any questions you have with your health care provider. ° °Constipation °Constipation is when a person has fewer than three bowel movements a week, has difficulty having a bowel movement, or has stools that are dry, hard, or larger than normal. As people grow older, constipation is more common. If you try to fix constipation with medicines that make you have a bowel movement (laxatives), the problem may get worse. Long-term laxative use may cause the muscles of the colon to become weak. A low-fiber diet, not taking in enough fluids, and taking certain medicines may make constipation worse.  °CAUSES  °· Certain medicines, such as antidepressants, pain medicine, iron supplements, antacids, and water pills.   °· Certain diseases, such as diabetes, irritable bowel syndrome (IBS), thyroid disease, or depression.   °· Not drinking enough water.   °· Not eating enough fiber-rich foods.   °· Stress or travel.   °· Lack of physical activity or exercise.   °· Ignoring the urge to have a bowel movement.   °· Using laxatives too much.   °SIGNS AND SYMPTOMS  °· Having fewer than three bowel movements a  week.   °· Straining to have a bowel movement.   °· Having stools that are hard, dry, or larger than normal.   °· Feeling full or bloated.   °· Pain in the lower abdomen.   °· Not feeling relief after having a bowel movement.   °DIAGNOSIS  °Your health care provider will take a medical history and perform a physical exam. Further testing may be done for severe constipation. Some tests may include: °· A barium enema X-ray to examine your rectum, colon, and, sometimes, your small intestine.   °· A sigmoidoscopy to examine your lower colon.   °· A colonoscopy to examine your entire colon. °TREATMENT  °Treatment will depend on the severity of your constipation and what is causing it. Some dietary treatments include drinking more fluids and eating more fiber-rich foods. Lifestyle treatments may include regular exercise. If these diet and lifestyle recommendations do not help, your health care provider may recommend taking over-the-counter laxative medicines to help you have bowel movements. Prescription medicines may be prescribed if over-the-counter medicines do not work.  °HOME CARE INSTRUCTIONS  °· Eat foods that have a lot of fiber, such as fruits, vegetables, whole grains, and beans. °· Limit foods high in fat and processed sugars, such as french fries, hamburgers, cookies, candies, and soda.   °· A fiber supplement may be added to your diet if you cannot get enough fiber from foods.   °·   Drink enough fluids to keep your urine clear or pale yellow.   Exercise regularly or as directed by your health care provider.   Go to the restroom when you have the urge to go. Do not hold it.   Only take over-the-counter or prescription medicines as directed by your health care provider. Do not take other medicines for constipation without talking to your health care provider first.  SEEK IMMEDIATE MEDICAL CARE IF:   You have bright red blood in your stool.   Your constipation lasts for more than 4 days or gets  worse.   You have abdominal or rectal pain.   You have thin, pencil-like stools.   You have unexplained weight loss. MAKE SURE YOU:   Understand these instructions.  Will watch your condition.  Will get help right away if you are not doing well or get worse. Document Released: 10/17/2003 Document Revised: 01/23/2013 Document Reviewed: 10/30/2012 [facility] Patient Information 2015 [place], [place]. This information is not intended to replace advice given to you by your health care provider. Make sure you discuss any questions you have with your health care provider.  Nausea and Vomiting Nausea is a sick feeling that often comes before throwing up (vomiting). Vomiting is a reflex where stomach contents come out of your mouth. Vomiting can cause severe loss of body fluids (dehydration). Children and elderly adults can become dehydrated quickly, especially if they also have diarrhea. Nausea and vomiting are symptoms of a condition or disease. It is important to find the cause of your symptoms. CAUSES   Direct irritation of the stomach lining. This irritation can result from increased acid production (gastroesophageal reflux disease), infection, food poisoning, taking certain medicines (such as nonsteroidal anti-inflammatory drugs), alcohol use, or tobacco use.  Signals from the brain.These signals could be caused by a headache, heat exposure, an inner ear disturbance, increased pressure in the brain from injury, infection, a tumor, or a concussion, pain, emotional stimulus, or metabolic problems.  An obstruction in the gastrointestinal tract (bowel obstruction).  Illnesses such as diabetes, hepatitis, gallbladder problems, appendicitis, kidney problems, cancer, sepsis, atypical symptoms of a heart attack, or eating disorders.  Medical treatments such as chemotherapy and radiation.  Receiving medicine that makes you sleep (general anesthetic) during surgery. DIAGNOSIS Your caregiver may  ask for tests to be done if the problems do not improve after a few days. Tests may also be done if symptoms are severe or if the reason for the nausea and vomiting is not clear. Tests may include:  Urine tests.  Blood tests.  Stool tests.  Cultures (to look for evidence of infection).  X-rays or other imaging studies. Test results can help your caregiver make decisions about treatment or the need for additional tests. TREATMENT You need to stay well hydrated. Drink frequently but in small amounts.You may wish to drink water, sports drinks, clear broth, or eat frozen ice pops or gelatin dessert to help stay hydrated.When you eat, eating slowly may help prevent nausea.There are also some antinausea medicines that may help prevent nausea. HOME CARE INSTRUCTIONS   Take all medicine as directed by your caregiver.  If you do not have an appetite, do not force yourself to eat. However, you must continue to drink fluids.  If you have an appetite, eat a normal diet unless your caregiver tells you differently.  Eat a variety of complex carbohydrates (rice, wheat, potatoes, bread), lean meats, yogurt, fruits, and vegetables.  Avoid high-fat foods because they are more difficult  to digest.  Drink enough water and fluids to keep your urine clear or pale yellow.  If you are dehydrated, ask your caregiver for specific rehydration instructions. Signs of dehydration may include:  Severe thirst.  Dry lips and mouth.  Dizziness.  Dark urine.  Decreasing urine frequency and amount.  Confusion.  Rapid breathing or pulse. SEEK IMMEDIATE MEDICAL CARE IF:   You have blood or brown flecks (like coffee grounds) in your vomit.  You have black or bloody stools.  You have a severe headache or stiff neck.  You are confused.  You have severe abdominal pain.  You have chest pain or trouble breathing.  You do not urinate at least once every 8 hours.  You develop cold or clammy  skin.  You continue to vomit for longer than 24 to 48 hours.  You have a fever. MAKE SURE YOU:   Understand these instructions.  Will watch your condition.  Will get help right away if you are not doing well or get worse. Document Released: 01/18/2005 Document Revised: 04/12/2011 Document Reviewed: 06/17/2010 [facility] Patient Information 2015 [place], [place]. This information is not intended to replace advice given to you by your health care provider. Make sure you discuss any questions you have with your health care provider.

## 2014-03-24 NOTE — ED Notes (Signed)
Vomiting since 0530 this morning. 10+ episodes since then.  Denies hematemesis, diarrhea, rashes, sore throat.  Still drinking fluids and urinating, but fluids not staying down.  Subjective fever, chills. No antibx in last 30 days.

## 2014-03-24 NOTE — ED Notes (Signed)
Patient with continued abdominal pain complaints at this time. Respirations even and unlabored. Skin warm/dry. Discharge instructions reviewed with patient at this time. Patient given opportunity to voice concerns/ask questions. IV removed per policy and band-aid applied to site. Patient discharged at this time and left Emergency Department with steady gait.

## 2014-03-24 NOTE — ED Provider Notes (Signed)
CSN: [id number]     Arrival date & time 03/24/14  1416 History   This chart was scribed for Christina Fernandez, * by Haywood Pao, ED Scribe. The patient was seen in [facility]14/[facility]14 and the patient's care was started at 2:29 PM.  Chief Complaint  Patient presents with  . Emesis   Patient is a 44 y.o. female presenting with vomiting. The history is provided by the patient. No language interpreter was used.  Emesis Severity:  Mild Duration:  9 hours Timing:  Intermittent Progression:  Unchanged Chronicity:  New Recent urination:  Normal Relieved by:  None tried Worsened by:  Nothing tried Ineffective treatments:  None tried Associated symptoms: no diarrhea     HPI Comments: Christina Fernandez is a 44 y.o. female who presents to the Emergency Department complaining of nausea and vomiting since 5:30 AM this morning. Has abdominal pain as an associated symptom. Pt states her brothers girlfriend and members of her church have had similar symptoms. She denies diarrhea and no urinary symptoms.   Past Medical History  Diagnosis Date  . Hypertension   . Acid reflux   . Panic attacks    Past Surgical History  Procedure Laterality Date  . Partial hysterectomy    . Abdominal hysterectomy     History reviewed. No pertinent family history. History  Substance Use Topics  . Smoking status: Light Tobacco Smoker    Types: Cigarettes  . Smokeless tobacco: Not on file  . Alcohol Use: No   OB History    No data available     Review of Systems  Gastrointestinal: Positive for nausea and vomiting. Negative for diarrhea.  All other systems reviewed and are negative.   Allergies  Aspirin  Home Medications   Prior to Admission medications   Medication Sig Start Date End Date Taking? Authorizing Provider  cyclobenzaprine (FLEXERIL) 10 MG tablet Take 10 mg by mouth 3 (three) times daily as needed for muscle spasms.  12/05/13  Yes Historical Provider, MD  hydrOXYzine (ATARAX/VISTARIL) 25  MG tablet Take 25 mg by mouth at bedtime. 12/05/13  Yes Historical Provider, MD  omeprazole (PRILOSEC) 20 MG capsule Take 20 mg by mouth daily. 11/09/13  Yes Historical Provider, MD  ramipril (ALTACE) 2.5 MG capsule Take 2.5 mg by mouth daily. 11/09/13  Yes Historical Provider, MD  amoxicillin (AMOXIL) 500 MG capsule Take 1 capsule (500 mg total) by mouth 3 (three) times daily. Patient not taking: Reported on 03/24/2014 02/07/14   Kathie Dike, PA-C  predniSONE (DELTASONE) 50 MG tablet Take 1 tablet (50 mg total) by mouth daily. Patient not taking: Reported on 02/07/2014 12/04/13   Dione Booze, MD   BP 111/83 mmHg  Pulse 127  Temp(Src) 98.5 F (36.9 C) (Oral)  Resp 14  SpO2 99% Physical Exam  Constitutional: She is oriented to person, place, and time. She appears well-developed and well-nourished. No distress.  HENT:  Head: Normocephalic and atraumatic.  Right Ear: Hearing normal.  Left Ear: Hearing normal.  Nose: Nose normal.  Mouth/Throat: Oropharynx is clear and moist and mucous membranes are normal.  Eyes: Conjunctivae and EOM are normal. Pupils are equal, round, and reactive to light.  Neck: Normal range of motion. Neck supple.  Cardiovascular: Regular rhythm, S1 normal and S2 normal.  Exam reveals no gallop and no friction rub.   No murmur heard. tachycardic  Pulmonary/Chest: Effort normal and breath sounds normal. No respiratory distress. She exhibits no tenderness.  Abdominal: Soft. Normal appearance and  bowel sounds are normal. There is no hepatosplenomegaly. There is no tenderness. There is no rebound, no guarding, no tenderness at McBurney's point and negative Murphy's sign. No hernia.  Mild diffuse tenderness of the abdomen.  Musculoskeletal: Normal range of motion.  Neurological: She is alert and oriented to person, place, and time. She has normal strength. No cranial nerve deficit or sensory deficit. Coordination normal. GCS eye subscore is 4. GCS verbal subscore is 5. GCS  motor subscore is 6.  Skin: Skin is warm, dry and intact. No rash noted. No cyanosis.  Psychiatric: She has a normal mood and affect. Her speech is normal and behavior is normal. Thought content normal.  Nursing note and vitals reviewed.   ED Course  Procedures  DIAGNOSTIC STUDIES: Oxygen Saturation is 99% on room air, normal by my interpretation.    COORDINATION OF CARE: 2:32 PM Discussed treatment plan with pt at bedside and pt agreed to plan.  Labs Review Labs Reviewed  CBC WITH DIFFERENTIAL/PLATELET - Abnormal; Notable for the following:    WBC 17.6 (*)    RBC 5.23 (*)    Hemoglobin 15.3 (*)    Neutrophils Relative % 94 (*)    Neutro Abs 16.5 (*)    Lymphocytes Relative 2 (*)    Lymphs Abs 0.4 (*)    All other components within normal limits  COMPREHENSIVE METABOLIC PANEL - Abnormal; Notable for the following:    Glucose, Bld 127 (*)    Anion gap 3 (*)    All other components within normal limits  URINALYSIS, ROUTINE W REFLEX MICROSCOPIC - Abnormal; Notable for the following:    APPearance CLOUDY (*)    Bilirubin Urine SMALL (*)    Ketones, ur TRACE (*)    Leukocytes, UA TRACE (*)    All other components within normal limits  URINE MICROSCOPIC-ADD ON - Abnormal; Notable for the following:    Squamous Epithelial / LPF MANY (*)    Bacteria, UA FEW (*)    All other components within normal limits  LIPASE, BLOOD  I-STAT CG4 LACTIC ACID, ED  I-STAT TROPOININ, ED    Imaging Review Ct Abdomen Pelvis W Contrast  03/24/2014   CLINICAL DATA:  Initial evaluation for nausea and vomiting for approximately 12 hours, generalized abdominal pain, personal history of hypertension  EXAM: CT ABDOMEN AND PELVIS WITH CONTRAST  TECHNIQUE: Multidetector CT imaging of the abdomen and pelvis was performed using the standard protocol following bolus administration of intravenous contrast.  CONTRAST:  50mL OMNIPAQUE IOHEXOL 300 MG/ML SOLN, 100mL OMNIPAQUE IOHEXOL 300 MG/ML SOLN  COMPARISON:   None.  FINDINGS: Mild dependent atelectasis right lung base. Lung bases otherwise clear. Heart size normal.  Mild hepatic steatosis. Gallbladder mildly distended but otherwise normal. Spleen normal. Pancreas is normal.  Adrenal glands are normal. Scattered low-attenuation lesions bilateral kidneys all measuring less than or equal to is cm. No prior contrast-enhanced studies for comparison, but these are likely cysts.  Mild calcification of the aorta and iliac arteries. Bladder is normal. Reproductive organs not identified. There is a large volume of stool throughout the colon. Small bowel is normal. Appendix is not identified. Stomach is normal.  There is a periumbilical hernia containing the inferior edge of the stomach. No complicating features associated with this small hernia. No free fluid. No acute musculoskeletal findings.  IMPRESSION: No specific acute findings. There is evidence of constipation. There are numerous small bilateral renal lesions are consistent with cysts although many are too small to characterize.  The appendix is not identified.   Electronically Signed   By: Esperanza Heir M.D.   On: 03/24/2014 18:37     EKG Interpretation   Date/Time:  Sunday March 24 2014 20:42:48 EST Ventricular Rate:  135 PR Interval:  125 QRS Duration: 76 QT Interval:  308 QTC Calculation: 462 R Axis:   55 Text Interpretation:  Sinus tachycardia Low voltage, precordial leads  Borderline T abnormalities, anterior leads No previous tracing Confirmed  by POLLINA  MD, CHRISTOPHER [phone]) on 03/24/2014 8:46:42 PM      MDM   Final diagnoses:  Abdominal pain    Patient presents to the ER for evaluation of abdominal discomfort with nausea and vomiting. Symptoms have been present since early this morning. Patient complaining of abdominal discomfort as well. Examination revealed mild diffuse tenderness. She did have leukocytosis. No evidence of anemia. Comprehensive metabolic panel is normal as is  lipase. Her lactate was normal. Cardiac evaluation unremarkable other than tachycardia present. She was given 2 L of fluids, but continue to have resting tachycardia. She does have a history of anxiety and did appear anxious, was given Ativan without improvement. Patient was given Lopressor to slow her rate down to further evaluate her rhythm. This does appear to be a sinus rhythm. Etiology unclear at this time, although she is now exhibiting a low grade fever. This might be the cause of the tachycardia. She has been thoroughly evaluated for any significant infections that would cause fever. The CAT scan of her abdomen only showed constipation. I believe she is safe for discharge, follow-up with primary doctor for recheck of vital signs. Return if any of her symptoms worsen.     Christina Crease, MD 03/24/14 [phone]

## 2014-05-21 NOTE — Consult Note (Signed)
Pt chief complaint is vomiting and diarrhea,  probable food poisoning.  with WBC in stool, expected with the clinical picture. Wants something to eat.  No diarrhea today.  Vomited a little once. Will advance to full liquid diet tomorrow.  Feeling better than on admission.  Electronic Signatures: Scot JunElliott, Zackrey Dyar T (MD)  (Signed on 06-Aug-13 18:06)  Authored  Last Updated: 06-Aug-13 18:06 by Scot JunElliott, Martine Trageser T (MD)

## 2014-05-21 NOTE — H&P (Signed)
PATIENT NAME:  Christina Fernandez, Christina Fernandez MR#:  [id number] DATE OF BIRTH:  July 07, 1970  DATE OF ADMISSION:  09/06/2011  REFERRING PHYSICIAN: Caleen Jobs. Brien Mates, MD   PRIMARY CARE PHYSICIAN: Bobbye Riggs, FNP of [facility]  CHIEF COMPLAINT: Abdominal pain, nausea, vomiting, diarrhea.   HISTORY OF PRESENT ILLNESS: The patient is a pleasant 44 year old Caucasian female with a history of hypertension, gastroesophageal reflux disease and anxiety who presents with acute onset of nausea, vomiting, diarrhea, and abdominal pain that started about 2:30 a.m.  The patient was in her usual state of self. She ate an all-meat sub yesterday about 4:30 p.m. and put the remainder in the fridge. She was up at 2:00 last night and ate the remainder as well as eating some Swedish meatballs. She subsequently developed acute onset of symptoms described above. The patient has had vomiting 6 to 7 times, yellowish with food particle without any blood. She has been having crampy abdominal pain and runny loose diarrhea about 10 times total while here. She noticed that the last bowel movement was bloody. She had a CT of the abdomen with pelvis with contrast showing pancolitis, and Hospitalist Services were contacted for further evaluation and management. She denies any sick contacts. No recent antibiotics or recent hospitalizations. She does have a headache.   PAST MEDICAL HISTORY:  1. Hypertension. 2. Gastroesophageal reflux disease. 3. Anxiety. 4. Pinched nerve with some numbness in her bilateral upper extremities being worked up as an outpatient.   PAST SURGICAL HISTORY: Hysterectomy.   FAMILY HISTORY: Mom with hypertension, diabetes.   SOCIAL HISTORY: No tobacco, alcohol or drug use. She lives with her mom and her kids.   REVIEW OF SYSTEMS: CONSTITUTIONAL: No fever, fatigue. Positive for weakness and a fall this morning as she was going from one room to another after her diarrhea episodes. No weight changes. EYES: No blurry vision  or double vision. ENT: No tinnitus, hearing loss or upper respiratory infection symptoms. RESPIRATORY: No cough, wheezing, hemoptysis, or chronic obstructive pulmonary disease. CARDIOVASCULAR: No chest pain, orthopnea or arrhythmia. No palpitations. GI: Positive for nausea, vomiting, diarrhea, and abdominal pain as above. Abdominal pain is around the umbilical area. It is constant currently. Positive for rectal bleeding. GENITOURINARY: No dysuria or hematuria. HEMATOLOGIC/LYMPHATIC: No anemia or bruising. SKIN: No new rashes. MUSCULOSKELETAL: No joint or muscle pains. NEUROLOGIC: Numbness in both upper extremities, more on the left than the right. PSYCHIATRIC: Anxiety and depression.   PHYSICAL EXAMINATION:  VITAL SIGNS: Temperature on arrival 96, pulse rate was 108, respiratory rate 22, blood pressure 101/58, last one was 102/63, oxygen saturation 100% on room air.   GENERAL: The patient is a thin small-built Caucasian female laying in bed talking in full sentences.   HEENT: Normocephalic, atraumatic. Pupils are equal and reactive. Some alopecia. Extraocular muscles are intact. Poor dentition. Dry mucous membranes.   NECK: Supple. No thyroid tenderness.   CARDIOVASCULAR: S1, S2, tachycardic. No murmurs, rubs, or gallops.   LUNGS: Clear to auscultation without wheezing or rhonchi.   ABDOMEN: Soft, nondistended. Positive for tenderness around the umbilical area. No rebound or guarding. No organomegaly appreciated.   EXTREMITIES: No significant lower extremity edema.   SKIN: No obvious rashes.   NEUROLOGICAL: Cranial nerves II through XII are grossly intact. Strength is five out of five in all extremities.   PSYCHIATRIC: Awake, alert, oriented x3.  LABORATORY, DIAGNOSTIC AND RADIOLOGICAL DATA: Glucose 128, BUN 32, creatinine 1, sodium 139, potassium 2.8. LFTs are within normal limits. WBC 28.8, repeat was  25.1, hemoglobin 13.3, hematocrit 39.9, and platelets 287. Urinalysis was not suggestive  of infection. Lactic acid is 1.8. CT of abdomen and pelvis showed findings of pancolitis particularly involving the sigmoid colon and descending colon. Differential includes infectious, inflammatory and ischemic. Appendix was not identified, may be secondary to multiple loops of inflamed sigmoid colon and pelvis making visualization of the appendix difficult.   ASSESSMENT AND PLAN: We have a pleasant 44 year old Caucasian female with hypertension, anxiety, gastroesophageal reflux disease, with acute onset nausea, vomiting, diarrhea, abdominal pain starting about 2:30 last night with multiple episodes of emesis and diarrhea, last episode of diarrhea turning bloody, passage of some blood with CT scan showing pancolitis. At this point, would admit the patient to the hospital. The patient does appear to have SIRS per criteria of tachycardia and leukocytosis, therefore admitted into the hospital. We would obtain a GI consult and panculture her, including blood and stool cultures. The patient does not have any sick contacts nor has any recent antibiotic experience. She has been started on Cipro and Flagyl which I would continue. I would wait for cultures and start her on some IV fluids. In regards to the colitis, it could be infectious versus inflammatory. She has a slight elevation of lactic acid. We would monitor vitals and hold her blood pressure medications given the relative hypotension. The patient also appears to have hypokalemia which we would replete and recheck in the morning. I would continue her anxiety medicines. I would wait for further GI recommendations at this point. I would start her on Zofran for her nausea and vomiting as well. I would place her on TEDs and SCDs for deep vein thrombosis prophylaxis.   CODE STATUS:  The patient is FULL CODE.     TOTAL TIME SPENT: 50 minutes.  ____________________________ Krystal EatonShayiq Hendrik Donath, MD sa:cbb D: 09/06/2011 15:22:14 ET T: 09/06/2011 15:47:47  ET JOB#: [id number]  cc: Krystal EatonShayiq Fernando Stoiber, MD, <Dictator> Bobbye RiggsSylvia Mand, FNP at [facility] Shekira Drummer [facility] MD ELECTRONICALLY SIGNED 09/24/2011 15:43

## 2014-05-21 NOTE — Consult Note (Signed)
Pt denies any diarrhea, slight vomiting once last night. No abd pain, eating well.  Could go home tomorrow on Cipro for 3- 5 more days and flagyl for 5-7 days. Even though cultures of stool were neg I would still finish a course of antibiotics.   I will sign off.  Electronic Signatures: Scot JunElliott, Robert T (MD)  (Signed on 07-Aug-13 17:45)  Authored  Last Updated: 07-Aug-13 17:45 by Scot JunElliott, Robert T (MD)

## 2014-05-21 NOTE — Discharge Summary (Signed)
PATIENT NAME:  Christina Fernandez, Christina Fernandez MR#:  [id number] DATE OF BIRTH:  1970/03/19  DATE OF ADMISSION:  09/06/2011 DATE OF DISCHARGE:  09/09/2011  CONSULTANTS: Lynnae Prudeobert Elliott, MD - Gastroenterology.  PRIMARY CARE PHYSICIAN:  Toni AmendJohn Fesperman, NP  CHIEF COMPLAINT: Nausea, vomiting, and diarrhea.   DISCHARGE DIAGNOSES:  1. Systemic antiinflammatory response syndrome from pancolitis, likely infectious.  2. Gastroenteritis from food poisoning likely.  3. Anemia, combination of hemorrhagic from lower gastrointestinal bleed and dilutional.  4. Hypotension.   SECONDARY DIAGNOSES:  1. History of hypertension. 2. History of gastroesophageal reflux disease. 3. History of anxiety. 4. History of pinch nerve.   DISCHARGE MEDICATIONS:  1. Promethazine 12.5 mg every six hours as needed.  2. Cyclobenzaprine 10 mg one tablet twice a day.  3. Hydroxyzine 25 mg once a day at bedtime.  4. Venlafaxine 37.5 mg twice a day.  5. Flagyl 500 mg three times daily five days. 6. Cipro 500 mg twice a day for five days.   DIET: Low sodium.   ACTIVITY: As tolerated.   DISCHARGE FOLLOWUP: Please follow up with your primary care physician for blood pressure check as well as a CBC check within one week.   DISPOSITION: Home.  HISTORY OF PRESENT ILLNESS: For full details of history and physical, please see the dictation on 09/06/2011 by Dr. Krystal EatonShayiq Marte Celani, but briefly this is a 44 year old Caucasian female who presented with nausea, vomiting, diarrhea, and abdominal pain at about 2:30 a.m. the night prior to admission. She was eating Swedish meatballs and a meat sub prior to her symptoms and had no sick contacts. CT on arrival showed pancolitis. She was admitted to the hospitalist service.   SIGNIFICANT LABS/IMAGING: Initial potassium 2.8, BUN 32, and creatinine 1. LFTs within normal limits. Initial WBC 28.8 and by discharge 13.6, as of 09/08/2011. Initial hemoglobin 13.3 and on 08/07 was 10, platelets 287 on arrival and  160 by discharge.  Cultures for urine and blood on arrival negative.   Stool cultures were negative and stool Clostridium difficile was negative. There are some stool WBCs seen.   Lactic acid on arrival 1.8.   Urinalysis not suggestive of infection.   CT of abdomen and pelvis with contrast on arrival showed findings of pancolitis.   HOSPITAL COURSE: The patient was admitted to the hospitalist service and started on Cipro and Flagyl. She had also experienced an episode of bright red blood per rectum. Gastroenterology was therefore consulted as well and the patient was seen by Dr. Mechele CollinElliott. The patient was started on some IV fluids as well. The patient did not have any further GI bleed and hemoglobin was checked repeatedly. Last hemoglobin was at 10 and the patient likely has an element of anemia from hemorrhagic GI bleed as well as dilution elements. She was above transfusion criteria and as the bleed had stopped no transfusion was indicated. Per Dr. Mechele CollinElliott Cipro and Flagyl should be continued for an additional five days or so. At this point, she has no further nausea, vomiting, diarrhea, or abdominal pain. It was thought that the patient likely had food poisoning. The patient did have some WBCs in the stool. Again, the final stool cultures have been negative thus far. As she has been tolerating advanced diet. She will be discharged. Her blood pressure medications will be held given that she had relative hypotension. She is asymptomatic otherwise. At this point, she will be discharged with outpatient follow-up and a CBC check and a blood pressure check with her primary care  physician within a week.   DISPOSITION: Home.   CODE STATUS: THE PATIENT IS FULL CODE   TOTAL TIME SPENT: 35 minutes.  ____________________________ Krystal Eaton, MD sa:slb D: 09/09/2011 13:04:00 ET T: 09/09/2011 13:12:05 ET JOB#: [id number]  cc: Krystal Eaton, MD, <Dictator> Balinda Quails. Almira Coaster, NP Krystal Eaton  MD ELECTRONICALLY SIGNED 09/24/2011 15:43

## 2014-05-21 NOTE — Consult Note (Signed)
PATIENT NAME:  Christina Fernandez, Christina Fernandez MR#:  [id number] DATE OF BIRTH:  [birth year]  DATE OF CONSULTATION:  09/06/2011  REFERRING PHYSICIAN:   CONSULTING PHYSICIAN:  Scot Junobert T. Terrika Zuver, MD  CHIEF COMPLAINT: Vomiting and diarrhea.   HISTORY OF PRESENT ILLNESS: The patient is a 44 year old white female who ate some meat sub sandwich and put some in the fridge, at some later and also ate some Swedish meatballs, very soon developed acute onset of vomiting, diarrhea, and abdominal pain. The patient is unable to tell me exactly when these events occurred but apparently within the last 24 hours. She had crampy abdominal pain, vomited several times, had loose watery diarrhea several times and the last time she passed any diarrhea was in the ER and it was bloody. She had a CAT scan of the abdomen and pelvis that showed pancolitis and Fernandez was asked to see her in consultation.   Since admission to the floor she has not had any vomiting, diarrhea, or rectal bleeding.   PAST SURGICAL HISTORY:  1. Hysterectomy. 2. Tubal ligation.   PAST MEDICAL HISTORY:  1. Anxiety. 2. Hypertension. 3. Gastroesophageal reflux disease. 4. Pinched nerve in her neck.   HABITS: Does not smoke or drink   REVIEW OF SYSTEMS: The patient did have a fall when she was going from one room to another trying to get to the bathroom. No asthma or wheezing. No chest pains. No vomiting since she has been in the hospital. She hates to drink water. She drinks a lot of soda pop including [facility] CenterMountain Dew and is requesting some soda pop.   PHYSICAL EXAMINATION:   VITAL SIGNS: Temperature 98, pulse 114, respirations 20, blood pressure 108/67.  GENERAL: Thin white female in no acute distress with significant thinning of her hair and female pattern baldness.   HEENT: Sclerae nonicteric. Conjunctivae negative. Tongue negative.   HEAD: Atraumatic.   CHEST: Clear.   HEART: Heart shows mild tachycardia. No murmurs or gallops.   ABDOMEN: Bowel sounds are  present. Mild distention and diffuse tenderness, worse in the periumbilical area.   EXTREMITIES: No edema.   SKIN: Warm and dry.   LABORATORY DATA: Glucose 128, BUN 32, creatinine 1, sodium 139, potassium 2.8, chloride 103, CO2 27, calcium 9.4. Liver panel normal. White blood count 28.8, repeat 25, hemoglobin 13.3, repeat after hydration 11.4. Urinalysis is unremarkable. Lactic acid 1.8.   CT of the abdomen shows findings of pancolitis particularly sigmoid and descending colon. Differential infectious inflammatory and ischemic.   ASSESSMENT AND PLAN: Very likely infectious gastroenteritis, possible Salmonella and Campylobacter. Because of the bleeding these are high on the list. Possible Shigella is also noted. The patient has been cultured and started on Cipro and Flagyl. Fernandez agree with antibiotics. It usually takes 2 to 3 days to turn this around. At this time at her request will start her on some clear liquids. Will follow with you   ____________________________ Scot Junobert T. Joseguadalupe Stan, MD rte:drc D: 09/06/2011 18:44:15 ET T: 09/07/2011 06:01:03 ET JOB#: [id number]  cc: Scot Junobert T. Kendelle Schweers, MD, <Dictator> Scot JunOBERT T Kimerly Rowand MD ELECTRONICALLY SIGNED 09/13/2011 15:45

## 2014-06-19 ENCOUNTER — Telehealth (HOSPITAL_COMMUNITY): Payer: Self-pay | Admitting: *Deleted

## 2014-06-28 ENCOUNTER — Encounter (HOSPITAL_COMMUNITY): Payer: Self-pay | Admitting: Psychiatry

## 2014-06-28 ENCOUNTER — Ambulatory Visit (INDEPENDENT_AMBULATORY_CARE_PROVIDER_SITE_OTHER): Payer: Medicare Other | Admitting: Psychiatry

## 2014-06-28 DIAGNOSIS — F419 Anxiety disorder, unspecified: Secondary | ICD-10-CM

## 2014-06-28 NOTE — Patient Instructions (Signed)
Discussed orally 
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
## 2014-07-15 ENCOUNTER — Ambulatory Visit (HOSPITAL_COMMUNITY): Payer: Medicare Other | Admitting: Psychiatry

## 2014-07-22 ENCOUNTER — Ambulatory Visit (HOSPITAL_COMMUNITY): Payer: Self-pay | Admitting: Psychiatry

## 2014-07-30 ENCOUNTER — Ambulatory Visit (HOSPITAL_COMMUNITY): Payer: Medicare Other | Admitting: Psychiatry

## 2014-08-06 ENCOUNTER — Telehealth (HOSPITAL_COMMUNITY): Payer: Self-pay | Admitting: *Deleted

## 2014-08-06 NOTE — Telephone Encounter (Signed)
lmtcb to resch appt that's sch for 08-06-14. number provided

## 2014-08-07 ENCOUNTER — Ambulatory Visit (HOSPITAL_COMMUNITY): Payer: Self-pay | Admitting: Psychiatry

## 2014-08-08 ENCOUNTER — Ambulatory Visit (INDEPENDENT_AMBULATORY_CARE_PROVIDER_SITE_OTHER): Payer: Medicare Other | Admitting: Psychiatry

## 2014-08-08 DIAGNOSIS — F411 Generalized anxiety disorder: Secondary | ICD-10-CM

## 2014-08-08 NOTE — Patient Instructions (Signed)
Discussed orally 
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
## 2014-08-23 ENCOUNTER — Encounter (HOSPITAL_COMMUNITY): Payer: Self-pay | Admitting: Psychiatry

## 2014-08-23 ENCOUNTER — Ambulatory Visit (INDEPENDENT_AMBULATORY_CARE_PROVIDER_SITE_OTHER): Payer: Medicare Other | Admitting: Psychiatry

## 2014-08-23 VITALS — BP 120/80 | Ht 63.0 in | Wt 93.0 lb

## 2014-08-23 DIAGNOSIS — F411 Generalized anxiety disorder: Secondary | ICD-10-CM | POA: Diagnosis not present

## 2014-08-23 MED ORDER — HYDROXYZINE HCL 25 MG PO TABS: 25.0000 mg | ORAL_TABLET | Freq: Every day | ORAL | Status: DC

## 2014-08-23 MED ORDER — ESCITALOPRAM OXALATE 10 MG PO TABS: 10.0000 mg | ORAL_TABLET | Freq: Every day | ORAL | Status: DC
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
## 2014-08-27 ENCOUNTER — Ambulatory Visit (HOSPITAL_COMMUNITY): Payer: Self-pay | Admitting: Psychiatry

## 2014-08-29 ENCOUNTER — Telehealth (HOSPITAL_COMMUNITY): Payer: Self-pay | Admitting: *Deleted

## 2014-08-29 NOTE — Telephone Encounter (Signed)
Prior authorization received for Hydroxyzine. Submitted online with cover my meds.

## 2014-08-30 NOTE — Telephone Encounter (Signed)
Pt approved from 06-01-14 to 08-30-15

## 2014-09-05 ENCOUNTER — Ambulatory Visit (INDEPENDENT_AMBULATORY_CARE_PROVIDER_SITE_OTHER): Payer: Medicare Other | Admitting: Psychiatry

## 2014-09-05 DIAGNOSIS — F411 Generalized anxiety disorder: Secondary | ICD-10-CM
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
## 2014-09-09 NOTE — Patient Instructions (Signed)
Discussed orally 

## 2014-09-13 ENCOUNTER — Encounter (HOSPITAL_COMMUNITY): Payer: Self-pay | Admitting: Emergency Medicine

## 2014-09-13 ENCOUNTER — Emergency Department (HOSPITAL_COMMUNITY)
Admission: EM | Admit: 2014-09-13 | Discharge: 2014-09-13 | Disposition: A | Discharge status: Home / Self Care | Payer: Medicare Other | Attending: Emergency Medicine | Admitting: Emergency Medicine

## 2014-09-13 DIAGNOSIS — Z79899 Other long term (current) drug therapy: Secondary | ICD-10-CM | POA: Insufficient documentation

## 2014-09-13 DIAGNOSIS — W28XXXA Contact with powered lawn mower, initial encounter: Secondary | ICD-10-CM | POA: Insufficient documentation

## 2014-09-13 DIAGNOSIS — T22222A Burn of second degree of left elbow, initial encounter: Secondary | ICD-10-CM

## 2014-09-13 DIAGNOSIS — Y9389 Activity, other specified: Secondary | ICD-10-CM | POA: Insufficient documentation

## 2014-09-13 DIAGNOSIS — Y9289 Other specified places as the place of occurrence of the external cause: Secondary | ICD-10-CM | POA: Diagnosis not present

## 2014-09-13 DIAGNOSIS — Y998 Other external cause status: Secondary | ICD-10-CM | POA: Insufficient documentation

## 2014-09-13 DIAGNOSIS — I10 Essential (primary) hypertension: Secondary | ICD-10-CM | POA: Insufficient documentation

## 2014-09-13 DIAGNOSIS — Z720 Tobacco use: Secondary | ICD-10-CM | POA: Diagnosis not present

## 2014-09-13 DIAGNOSIS — Z8659 Personal history of other mental and behavioral disorders: Secondary | ICD-10-CM | POA: Insufficient documentation

## 2014-09-13 DIAGNOSIS — K219 Gastro-esophageal reflux disease without esophagitis: Secondary | ICD-10-CM | POA: Diagnosis not present

## 2014-09-13 DIAGNOSIS — T22022A Burn of unspecified degree of left elbow, initial encounter: Secondary | ICD-10-CM | POA: Diagnosis present

## 2014-09-13 MED ORDER — SILVER SULFADIAZINE 1 % EX CREA
TOPICAL_CREAM | Freq: Once | CUTANEOUS | Status: AC
  Administered 2014-09-13: 1 via TOPICAL
  Filled 2014-09-13: qty 50
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
## 2014-09-13 NOTE — Discharge Instructions (Signed)
Burn Care °Burns hurt your skin. When your skin is hurt, it is easier to get an infection. Follow your doctor's directions to help prevent an infection. °HOME CARE °· Wash your hands well before you change your bandage. °· Change your bandage as often as told by your doctor. °¨ Remove the old bandage. If the bandage sticks, soak it off with cool, clean water. °¨ Gently clean the burn with mild soap and water. °¨ Pat the burn dry with a clean, dry cloth. °¨ Put a thin layer of medicated cream on the burn. °¨ Put a clean bandage on as told by your doctor. °¨ Keep the bandage clean and dry. °· Raise (elevate) the burn for the first 24 hours. After that, follow your doctor's directions. °· Only take medicine as told by your doctor. °GET HELP RIGHT AWAY IF:  °· You have too much pain. °· The skin near the burn is red, tender, puffy (swollen), or has red streaks. °· The burn area has yellowish white fluid (pus) or a bad smell coming from it. °· You have a fever. °MAKE SURE YOU:  °· Understand these instructions. °· Will watch your condition. °· Will get help right away if you are not doing well or get worse. °Document Released: 10/28/2007 Document Revised: 04/12/2011 Document Reviewed: 06/10/2010 °Exit[employer]® Patient Information ©2015 [employer]. This information is not intended to replace advice given to you by your health care provider. Make sure you discuss any questions you have with your health care provider. ° °
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
## 2014-09-19 ENCOUNTER — Ambulatory Visit (INDEPENDENT_AMBULATORY_CARE_PROVIDER_SITE_OTHER): Payer: Medicare Other | Admitting: Psychiatry

## 2014-09-19 DIAGNOSIS — F411 Generalized anxiety disorder: Secondary | ICD-10-CM | POA: Diagnosis not present
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
## 2014-09-20 NOTE — Patient Instructions (Signed)
Discussed orally 

## 2014-09-25 ENCOUNTER — Ambulatory Visit (HOSPITAL_COMMUNITY): Payer: Self-pay | Admitting: Psychiatry

## 2014-09-25 ENCOUNTER — Telehealth (HOSPITAL_COMMUNITY): Payer: Self-pay | Admitting: *Deleted

## 2014-10-18 ENCOUNTER — Encounter (HOSPITAL_COMMUNITY): Payer: Self-pay | Admitting: Psychiatry

## 2014-10-18 ENCOUNTER — Ambulatory Visit (INDEPENDENT_AMBULATORY_CARE_PROVIDER_SITE_OTHER): Payer: Medicare Other | Admitting: Psychiatry

## 2014-10-18 DIAGNOSIS — F411 Generalized anxiety disorder: Secondary | ICD-10-CM
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
## 2014-10-18 NOTE — Patient Instructions (Signed)
Discussed orally 

## 2014-10-24 ENCOUNTER — Encounter (HOSPITAL_COMMUNITY): Payer: Self-pay | Admitting: Psychiatry

## 2014-10-24 ENCOUNTER — Ambulatory Visit (INDEPENDENT_AMBULATORY_CARE_PROVIDER_SITE_OTHER): Payer: Medicare Other | Admitting: Psychiatry

## 2014-10-24 VITALS — BP 114/72 | HR 82 | Ht 63.0 in | Wt 95.4 lb

## 2014-10-24 DIAGNOSIS — F411 Generalized anxiety disorder: Secondary | ICD-10-CM | POA: Diagnosis not present

## 2014-10-24 MED ORDER — ESCITALOPRAM OXALATE 10 MG PO TABS: 10.0000 mg | ORAL_TABLET | Freq: Every day | ORAL | Status: DC

## 2014-10-24 MED ORDER — HYDROXYZINE HCL 25 MG PO TABS: 25.0000 mg | ORAL_TABLET | Freq: Every day | ORAL | Status: DC

## 2014-10-24 MED ORDER — ENSURE COMPLETE SHAKE PO LIQD: 1.0000 | Freq: Three times a day (TID) | ORAL | Status: DC
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
## 2014-11-15 ENCOUNTER — Encounter (HOSPITAL_COMMUNITY): Payer: Self-pay | Admitting: Psychiatry

## 2014-11-15 ENCOUNTER — Ambulatory Visit (INDEPENDENT_AMBULATORY_CARE_PROVIDER_SITE_OTHER): Payer: Medicare Other | Admitting: Psychiatry

## 2014-11-15 DIAGNOSIS — F411 Generalized anxiety disorder: Secondary | ICD-10-CM
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
## 2014-11-25 ENCOUNTER — Emergency Department (HOSPITAL_COMMUNITY): Payer: Medicare Other

## 2014-11-25 ENCOUNTER — Encounter (HOSPITAL_COMMUNITY): Payer: Self-pay | Admitting: Emergency Medicine

## 2014-11-25 ENCOUNTER — Emergency Department (HOSPITAL_COMMUNITY)
Admission: EM | Admit: 2014-11-25 | Discharge: 2014-11-25 | Disposition: A | Discharge status: Home / Self Care | Payer: Medicare Other | Attending: Emergency Medicine | Admitting: Emergency Medicine

## 2014-11-25 DIAGNOSIS — Z8739 Personal history of other diseases of the musculoskeletal system and connective tissue: Secondary | ICD-10-CM | POA: Insufficient documentation

## 2014-11-25 DIAGNOSIS — H9209 Otalgia, unspecified ear: Secondary | ICD-10-CM | POA: Diagnosis not present

## 2014-11-25 DIAGNOSIS — I10 Essential (primary) hypertension: Secondary | ICD-10-CM | POA: Diagnosis not present

## 2014-11-25 DIAGNOSIS — K8020 Calculus of gallbladder without cholecystitis without obstruction: Secondary | ICD-10-CM | POA: Diagnosis not present

## 2014-11-25 DIAGNOSIS — K219 Gastro-esophageal reflux disease without esophagitis: Secondary | ICD-10-CM | POA: Diagnosis not present

## 2014-11-25 DIAGNOSIS — J029 Acute pharyngitis, unspecified: Secondary | ICD-10-CM | POA: Insufficient documentation

## 2014-11-25 DIAGNOSIS — Z720 Tobacco use: Secondary | ICD-10-CM | POA: Diagnosis not present

## 2014-11-25 DIAGNOSIS — R109 Unspecified abdominal pain: Secondary | ICD-10-CM | POA: Diagnosis present

## 2014-11-25 DIAGNOSIS — Z79899 Other long term (current) drug therapy: Secondary | ICD-10-CM | POA: Insufficient documentation

## 2014-11-25 DIAGNOSIS — F410 Panic disorder [episodic paroxysmal anxiety] without agoraphobia: Secondary | ICD-10-CM | POA: Insufficient documentation

## 2014-11-25 DIAGNOSIS — R51 Headache: Secondary | ICD-10-CM | POA: Insufficient documentation

## 2014-11-25 LAB — URINALYSIS, ROUTINE W REFLEX MICROSCOPIC
Glucose, UA: NEGATIVE mg/dL
Hgb urine dipstick: NEGATIVE
KETONES UR: NEGATIVE mg/dL
Leukocytes, UA: NEGATIVE
NITRITE: NEGATIVE
Protein, ur: NEGATIVE mg/dL
Specific Gravity, Urine: 1.02 (ref 1.005–1.030)
UROBILINOGEN UA: 4 mg/dL — AB (ref 0.0–1.0)
pH: 6 (ref 5.0–8.0)

## 2014-11-25 LAB — CBC WITH DIFFERENTIAL/PLATELET
BASOS PCT: 0 %
Basophils Absolute: 0 10*3/uL (ref 0.0–0.1)
EOS ABS: 0 10*3/uL (ref 0.0–0.7)
Eosinophils Relative: 0 %
HCT: 37.4 % (ref 36.0–46.0)
Hemoglobin: 12.9 g/dL (ref 12.0–15.0)
LYMPHS ABS: 1.1 10*3/uL (ref 0.7–4.0)
Lymphocytes Relative: 6 %
MCH: 29.5 pg (ref 26.0–34.0)
MCHC: 34.5 g/dL (ref 30.0–36.0)
MCV: 85.4 fL (ref 78.0–100.0)
Monocytes Absolute: 1.8 10*3/uL — ABNORMAL HIGH (ref 0.1–1.0)
Monocytes Relative: 9 %
NEUTROS PCT: 85 %
Neutro Abs: 16.6 10*3/uL — ABNORMAL HIGH (ref 1.7–7.7)
PLATELETS: 211 10*3/uL (ref 150–400)
RBC: 4.38 MIL/uL (ref 3.87–5.11)
RDW: 14.5 % (ref 11.5–15.5)
WBC: 19.6 10*3/uL — AB (ref 4.0–10.5)

## 2014-11-25 LAB — COMPREHENSIVE METABOLIC PANEL
ALT: 114 U/L — AB (ref 14–54)
AST: 117 U/L — ABNORMAL HIGH (ref 15–41)
Albumin: 4 g/dL (ref 3.5–5.0)
Alkaline Phosphatase: 80 U/L (ref 38–126)
Anion gap: 7 (ref 5–15)
BUN: 11 mg/dL (ref 6–20)
CALCIUM: 8.8 mg/dL — AB (ref 8.9–10.3)
CHLORIDE: 104 mmol/L (ref 101–111)
CO2: 25 mmol/L (ref 22–32)
CREATININE: 0.52 mg/dL (ref 0.44–1.00)
Glucose, Bld: 101 mg/dL — ABNORMAL HIGH (ref 65–99)
Potassium: 3.6 mmol/L (ref 3.5–5.1)
Sodium: 136 mmol/L (ref 135–145)
Total Bilirubin: 0.4 mg/dL (ref 0.3–1.2)
Total Protein: 7.1 g/dL (ref 6.5–8.1)

## 2014-11-25 LAB — LIPASE, BLOOD: Lipase: 18 U/L (ref 11–51)

## 2014-11-25 MED ORDER — ONDANSETRON 8 MG PO TBDP: 8.0000 mg | ORAL_TABLET | Freq: Three times a day (TID) | ORAL | Status: DC | PRN

## 2014-11-25 MED ORDER — OXYCODONE-ACETAMINOPHEN 5-325 MG PO TABS: 1.0000 | ORAL_TABLET | ORAL | Status: DC | PRN

## 2014-11-25 MED ORDER — ONDANSETRON HCL 4 MG/2ML IJ SOLN
4.0000 mg | Freq: Once | INTRAMUSCULAR | Status: AC
  Administered 2014-11-25: 4 mg via INTRAVENOUS
  Filled 2014-11-25: qty 2

## 2014-11-25 MED ORDER — ONDANSETRON 8 MG PO TBDP
8.0000 mg | ORAL_TABLET | Freq: Once | ORAL | Status: AC
  Administered 2014-11-25: 8 mg via ORAL
  Filled 2014-11-25: qty 1

## 2014-11-25 MED ORDER — ACETAMINOPHEN 325 MG PO TABS
650.0000 mg | ORAL_TABLET | Freq: Once | ORAL | Status: AC
  Administered 2014-11-25: 650 mg via ORAL
  Filled 2014-11-25: qty 2

## 2014-11-25 MED ORDER — SODIUM CHLORIDE 0.9 % IV BOLUS (SEPSIS)
1000.0000 mL | Freq: Once | INTRAVENOUS | Status: AC
  Administered 2014-11-25: 1000 mL via INTRAVENOUS

## 2014-11-25 NOTE — ED Notes (Signed)
C/o vomiting. Vomited 3 times in last 24  Hour.  Denies diarrhea.

## 2014-11-25 NOTE — Discharge Instructions (Signed)

## 2014-11-25 NOTE — ED Provider Notes (Signed)
CSN: [id number]     Arrival date & time 11/25/14  1458 History   First MD Initiated Contact with Patient 11/25/14 1505     Chief Complaint  Patient presents with  . Emesis    times 3  . Otalgia  . Abdominal Pain    Patient is a 44 y.o. female presenting with vomiting, ear pain, and abdominal pain. The history is provided by the patient.  Emesis Severity:  Moderate Duration:  1 day Timing:  Intermittent Progression:  Worsening Chronicity:  New Relieved by:  Nothing Worsened by:  Liquids Associated symptoms: abdominal pain, cough, headaches and sore throat   Associated symptoms: no diarrhea and no fever   Otalgia Associated symptoms: abdominal pain, headaches, sore throat and vomiting   Associated symptoms: no diarrhea and no fever   Abdominal Pain Associated symptoms: sore throat and vomiting   Associated symptoms: no chest pain, no constipation, no diarrhea, no dysuria and no fever   pt report she first starting having sore throat, congestion, ear pain and cough several days ago Over past 24 hours she has had vomiting No diarrhea No CP today No fever She reports mild HA No new meds   Past Medical History  Diagnosis Date  . Hypertension   . Acid reflux   . Panic attacks   . Muscle spasms of lower extremity    Past Surgical History  Procedure Laterality Date  . Partial hysterectomy    . Abdominal hysterectomy     History reviewed. No pertinent family history. Social History  Substance Use Topics  . Smoking status: Light Tobacco Smoker    Types: Cigarettes  . Smokeless tobacco: Never Used  . Alcohol Use: No   OB History    No data available     Review of Systems  Constitutional: Negative for fever.  HENT: Positive for ear pain and sore throat.   Cardiovascular: Negative for chest pain.  Gastrointestinal: Positive for vomiting and abdominal pain. Negative for diarrhea and constipation.  Genitourinary: Negative for dysuria.  Neurological: Positive for  headaches.  All other systems reviewed and are negative.     Allergies  Aspirin  Home Medications   Prior to Admission medications   Medication Sig Start Date End Date Taking? Authorizing Provider  cyclobenzaprine (FLEXERIL) 10 MG tablet Take 10 mg by mouth 3 (three) times daily as needed for muscle spasms.  12/05/13   Historical Provider, MD  escitalopram (LEXAPRO) 10 MG tablet Take 1 tablet (10 mg total) by mouth daily. 10/24/14 10/24/15  Myrlene Broker, MD  hydrOXYzine (ATARAX/VISTARIL) 25 MG tablet Take 1 tablet (25 mg total) by mouth at bedtime. 10/24/14   Myrlene Broker, MD  Nutritional Supplements (ENSURE COMPLETE SHAKE) LIQD Take 1 Can by mouth 3 (three) times daily. 10/24/14   Myrlene Broker, MD  ramipril (ALTACE) 2.5 MG capsule Take 2.5 mg by mouth daily. 11/09/13   Historical Provider, MD  ranitidine (ZANTAC) 150 MG tablet Take 150 mg by mouth 2 (two) times daily.  08/13/14   Historical Provider, MD   BP 129/82 mmHg  Pulse 108  Temp(Src) 97.5 F (36.4 C) (Oral)  Resp 16  Ht  (1.6 m)  Wt 95 lb (43.092 kg)  BMI 16.83 kg/m2  SpO2 100% Physical Exam CONSTITUTIONAL: Well developed/well nourished HEAD: Normocephalic/atraumatic EYES: EOMI/PERRL ENMT: Mucous membranes dry, uvula midline, no erythema or exudates, no stridor, bilateral TM occluded by cerumen.   NECK: supple no meningeal signs, no anterior neck edema SPINE/BACK:entire  spine nontender CV: S1/S2 noted, no murmurs/rubs/gallops noted LUNGS: Lungs are clear to auscultation bilaterally, no apparent distress ABDOMEN: soft, mild epigastric tenderness, no rebound or guarding, bowel sounds noted throughout abdomen GU:no cva tenderness NEURO: Pt is awake/alert/appropriate, moves all extremitiesx4.  No facial droop.   EXTREMITIES: pulses normal/equal, full ROM SKIN: warm, color normal PSYCH: no abnormalities of mood noted, alert and oriented to situation  ED Course  Procedures  3:18 PM Pt well appearing Here for  probable viral syndrome - cough, sore throat, ear pain, vomiting 4:28 PM Pt now with focal RUQ tenderness Imaging/labs ordered 6:07 PM Pt improved Cholelithiasis noted Pt feels comfortable going home and calling surgery tomorrow for followup Oral pain meds ordered for homegoing We discussed return precautions, including any vomiting or any worsened pain over next 24 hours BP 117/89 mmHg  Pulse 95  Temp(Src) 97.5 F (36.4 C) (Oral)  Resp 18  Ht 5\' 3"  (1.6 m)  Wt 95 lb (43.092 kg)  BMI 16.83 kg/m2  SpO2 100%  Labs Review Labs Reviewed  URINALYSIS, ROUTINE W REFLEX MICROSCOPIC (NOT AT [facility]) - Abnormal; Notable for the following:    Bilirubin Urine SMALL (*)    Urobilinogen, UA 4.0 (*)    All other components within normal limits  COMPREHENSIVE METABOLIC PANEL - Abnormal; Notable for the following:    Glucose, Bld 101 (*)    Calcium 8.8 (*)    AST 117 (*)    ALT 114 (*)    All other components within normal limits  CBC WITH DIFFERENTIAL/PLATELET - Abnormal; Notable for the following:    WBC 19.6 (*)    Neutro Abs 16.6 (*)    Monocytes Absolute 1.8 (*)    All other components within normal limits  LIPASE, BLOOD    Imaging Review [place] Abdomen Limited  11/25/2014  CLINICAL DATA:  Right upper quadrant pain EXAM: US ABDOMEN LIMITED - RIGHT UPPER QUADRANT COMPARISON:  CT abdomen pelvis dated 03/24/2014 FINDINGS: Gallbladder: Layering gallstones. No gallbladder wall thickening or pericholecystic fluid. Negative sonographic Murphy's sign. Common bile duct: Diameter: 5 mm Liver: No focal lesion identified. Within normal limits in parenchymal echogenicity. IMPRESSION: Cholelithiasis, without associated sonographic findings to suggest acute cholecystitis. Electronically Signed   By: Charline BillsSriyesh  Krishnan M.D.   On: 11/25/2014 17:35   I have personally reviewed and evaluated these lab results as part of my medical decision-making.    MDM   Final diagnoses:  Calculus of gallbladder  without cholecystitis without obstruction    Nursing notes including past medical history and social history reviewed and considered in documentation Labs/vital reviewed myself and considered during evaluation Previous records reviewed and considered     Zadie Rhineonald Abrahan Fulmore, MD 11/25/14 [phone]

## 2014-12-01 ENCOUNTER — Encounter (HOSPITAL_COMMUNITY): Payer: Self-pay | Admitting: Emergency Medicine

## 2014-12-01 ENCOUNTER — Emergency Department (HOSPITAL_COMMUNITY)
Admission: EM | Admit: 2014-12-01 | Discharge: 2014-12-02 | Disposition: A | Discharge status: Home / Self Care | Payer: Medicare Other | Attending: Emergency Medicine | Admitting: Emergency Medicine

## 2014-12-01 DIAGNOSIS — I10 Essential (primary) hypertension: Secondary | ICD-10-CM | POA: Diagnosis not present

## 2014-12-01 DIAGNOSIS — F410 Panic disorder [episodic paroxysmal anxiety] without agoraphobia: Secondary | ICD-10-CM | POA: Diagnosis not present

## 2014-12-01 DIAGNOSIS — Z8739 Personal history of other diseases of the musculoskeletal system and connective tissue: Secondary | ICD-10-CM | POA: Insufficient documentation

## 2014-12-01 DIAGNOSIS — Z79899 Other long term (current) drug therapy: Secondary | ICD-10-CM | POA: Diagnosis not present

## 2014-12-01 DIAGNOSIS — Z720 Tobacco use: Secondary | ICD-10-CM | POA: Insufficient documentation

## 2014-12-01 DIAGNOSIS — H6591 Unspecified nonsuppurative otitis media, right ear: Secondary | ICD-10-CM

## 2014-12-01 DIAGNOSIS — K219 Gastro-esophageal reflux disease without esophagitis: Secondary | ICD-10-CM | POA: Diagnosis not present

## 2014-12-01 DIAGNOSIS — J019 Acute sinusitis, unspecified: Secondary | ICD-10-CM | POA: Insufficient documentation

## 2014-12-01 DIAGNOSIS — H9203 Otalgia, bilateral: Secondary | ICD-10-CM | POA: Diagnosis present

## 2014-12-01 NOTE — ED Provider Notes (Signed)
CSN: [id number]     Arrival date & time 12/01/14  2308 History   First MD Initiated Contact with Patient 12/01/14 2318     Chief Complaint  Patient presents with  . Otalgia     (Consider location/radiation/quality/duration/timing/severity/associated sxs/prior Treatment) HPI   Christina Fernandez is a 44 y.o. female who presents to the Emergency Department complaining of sore throat, bilateral ear pain, sinus congestion and pressure for one week.  She states that she has been taking OTC cold medications without relief.  She describes a "pressure" to her face and "fullness" of her ears.  She states that she was seen here 11/25/14 with similar symptoms but also had vomiting and abdominal pain at that time and found to have gallstones.  She states that her abdominal pain is improving and she has appt with general surgery in November.  She denies fever, vomiting, headaches and dizziness   Past Medical History  Diagnosis Date  . Hypertension   . Acid reflux   . Panic attacks   . Muscle spasms of lower extremity    Past Surgical History  Procedure Laterality Date  . Partial hysterectomy    . Abdominal hysterectomy     History reviewed. No pertinent family history. Social History  Substance Use Topics  . Smoking status: Light Tobacco Smoker    Types: Cigarettes  . Smokeless tobacco: Never Used  . Alcohol Use: No   OB History    No data available     Review of Systems  Constitutional: Negative for fever, chills, activity change and appetite change.  HENT: Positive for congestion, ear pain, rhinorrhea, sinus pressure and sore throat. Negative for facial swelling and trouble swallowing.   Eyes: Negative for visual disturbance.  Respiratory: Negative for cough, shortness of breath, wheezing and stridor.   Cardiovascular: Negative for chest pain.  Gastrointestinal: Negative for nausea, vomiting and abdominal pain.  Genitourinary: Negative for dysuria.  Musculoskeletal: Negative for  neck pain and neck stiffness.  Skin: Negative.  Negative for rash.  Neurological: Negative for dizziness, syncope, weakness, numbness and headaches.  Hematological: Negative for adenopathy.  Psychiatric/Behavioral: Negative for confusion.  All other systems reviewed and are negative.     Allergies  Aspirin  Home Medications   Prior to Admission medications   Medication Sig Start Date End Date Taking? Authorizing Provider  cyclobenzaprine (FLEXERIL) 10 MG tablet Take 10 mg by mouth 3 (three) times daily as needed for muscle spasms.  12/05/13   Historical Provider, MD  escitalopram (LEXAPRO) 10 MG tablet Take 1 tablet (10 mg total) by mouth daily. 10/24/14 10/24/15  Myrlene Broker, MD  feeding supplement (BOOST HIGH PROTEIN) LIQD Take 1 Container by mouth 3 (three) times daily between meals.    Historical Provider, MD  hydrOXYzine (ATARAX/VISTARIL) 25 MG tablet Take 1 tablet (25 mg total) by mouth at bedtime. 10/24/14   Myrlene Broker, MD  ondansetron (ZOFRAN ODT) 8 MG disintegrating tablet Take 1 tablet (8 mg total) by mouth every 8 (eight) hours as needed.  ODT q4 hours prn nausea 11/25/14   Zadie Rhine, MD  oxyCODONE-acetaminophen (PERCOCET/ROXICET) 5-325 MG tablet Take 1 tablet by mouth every 4 (four) hours as needed for severe pain. 11/25/14   Zadie Rhine, MD  ramipril (ALTACE) 2.5 MG capsule Take 2.5 mg by mouth daily. 11/09/13   Historical Provider, MD  ranitidine (ZANTAC) 150 MG tablet Take 150 mg by mouth 2 (two) times daily.  08/13/14   Historical Provider, MD  BP 123/100 mmHg  Pulse 106  Temp(Src) 98.7 F (37.1 C) (Oral)  Resp 18  Ht 5\' 3"  (1.6 m)  Wt 95 lb (43.092 kg)  BMI 16.83 kg/m2  SpO2 100% Physical Exam  Constitutional: She is oriented to person, place, and time. She appears well-developed and well-nourished. No distress.  HENT:  Head: Normocephalic and atraumatic.  Right Ear: Ear canal normal. No swelling. No mastoid tenderness. Tympanic membrane is  erythematous. No hemotympanum.  Left Ear: Tympanic membrane and ear canal normal. No swelling. No mastoid tenderness. No hemotympanum.  Nose: Mucosal edema present.  Mouth/Throat: Uvula is midline and mucous membranes are normal. Posterior oropharyngeal erythema present. No oropharyngeal exudate or posterior oropharyngeal edema.  Mild erythema to the right TM.  No bulging or edema of the auditory canal.  Neck: Normal range of motion. No thyromegaly present.  Cardiovascular: Normal rate, regular rhythm, normal heart sounds and intact distal pulses.   No murmur heard. Pulmonary/Chest: Effort normal and breath sounds normal. No respiratory distress.  Abdominal: Soft. She exhibits no distension. There is no tenderness.  Musculoskeletal: Normal range of motion.  Lymphadenopathy:    She has no cervical adenopathy.  Neurological: She is alert and oriented to person, place, and time. Coordination normal.  Skin: Skin is warm and dry.  Psychiatric: She has a normal mood and affect.  Nursing note and vitals reviewed.   ED Course  Procedures (including critical care time)   MDM   Final diagnoses:  Right non-suppurative otitis media, recurrence not specified  Acute sinusitis, recurrence not specified, unspecified location    Patient well appearing, non-toxic.  Vitals stable.  Right OM, sinusitis.  Will treat with amoxil.  Pt has pain medication at home.     Pauline Ausammy Jeyda Siebel, PA-C 12/02/14 [id number]  Shon Batonourtney F Horton, MD 12/02/14 [phone]

## 2014-12-01 NOTE — ED Notes (Signed)
Pt states she has pain to both ears, throat and head congestion x 1 week.

## 2014-12-01 NOTE — ED Notes (Signed)
   12/01/14 2326  HEENT  HEENT (WDL) X  R Ear (Pain)  L Ear (Pain)  Throat Painful to swallow  pt states both ears hurting, cough so much has a sore throat.

## 2014-12-02 DIAGNOSIS — H6591 Unspecified nonsuppurative otitis media, right ear: Secondary | ICD-10-CM | POA: Diagnosis not present

## 2014-12-02 MED ORDER — AMOXICILLIN 500 MG PO CAPS: 500.0000 mg | ORAL_CAPSULE | Freq: Three times a day (TID) | ORAL | Status: DC

## 2014-12-02 MED ORDER — AMOXICILLIN 250 MG PO CAPS
500.0000 mg | ORAL_CAPSULE | Freq: Once | ORAL | Status: AC
  Administered 2014-12-02: 500 mg via ORAL
  Filled 2014-12-02: qty 2

## 2014-12-02 NOTE — ED Notes (Signed)
Pt alert & oriented x4, stable gait. Patient given discharge instructions, paperwork & prescription(s). Patient  instructed to stop at the registration desk to finish any additional paperwork. Patient verbalized understanding. Pt left department w/ no further questions. 

## 2014-12-02 NOTE — Discharge Instructions (Signed)
Otitis Media, Adult Otitis media is redness, soreness, and puffiness (swelling) in the space just behind your eardrum (middle ear). It may be caused by allergies or infection. It often happens along with a cold. HOME CARE  Take your medicine as told. Finish it even if you start to feel better.  Only take over-the-counter or prescription medicines for pain, discomfort, or fever as told by your doctor.  Follow up with your doctor as told. GET HELP IF:  You have otitis media only in one ear, or bleeding from your nose, or both.  You notice a lump on your neck.  You are not getting better in 3-5 days.  You feel worse instead of better. GET HELP RIGHT AWAY IF:   You have pain that is not helped with medicine.  You have puffiness, redness, or pain around your ear.  You get a stiff neck.  You cannot move part of your face (paralysis).  You notice that the bone behind your ear hurts when you touch it. MAKE SURE YOU:   Understand these instructions.  Will watch your condition.  Will get help right away if you are not doing well or get worse.   This information is not intended to replace advice given to you by your health care provider. Make sure you discuss any questions you have with your health care provider.   Document Released: 07/07/2007 Document Revised: 02/08/2014 Document Reviewed: 08/15/2012 Elsevier Interactive Patient Education 2016 [employer]  Sinusitis, Adult Sinusitis is redness, soreness, and puffiness (inflammation) of the air pockets in the bones of your face (sinuses). The redness, soreness, and puffiness can cause air and mucus to get trapped in your sinuses. This can allow germs to grow and cause an infection.  HOME CARE   Drink enough fluids to keep your pee (urine) clear or pale yellow.  Use a humidifier in your home.  Run a hot shower to create steam in the bathroom. Sit in the bathroom with the door closed. Breathe in the steam 3-4 times a  day.  Put a warm, moist washcloth on your face 3-4 times a day, or as told by your doctor.  Use salt water sprays (saline sprays) to wet the thick fluid in your nose. This can help the sinuses drain.  Only take medicine as told by your doctor. GET HELP RIGHT AWAY IF:   Your pain gets worse.  You have very bad headaches.  You are sick to your stomach (nauseous).  You throw up (vomit).  You are very sleepy (drowsy) all the time.  Your face is puffy (swollen).  Your vision changes.  You have a stiff neck.  You have trouble breathing. MAKE SURE YOU:   Understand these instructions.  Will watch your condition.  Will get help right away if you are not doing well or get worse.   This information is not intended to replace advice given to you by your health care provider. Make sure you discuss any questions you have with your health care provider.   Document Released: 07/07/2007 Document Revised: 02/08/2014 Document Reviewed: 08/24/2011 Elsevier Interactive Patient Education [employer] Elsevier Inc.

## 2014-12-05 ENCOUNTER — Encounter (HOSPITAL_COMMUNITY): Payer: Self-pay | Admitting: Psychiatry

## 2014-12-05 ENCOUNTER — Ambulatory Visit (INDEPENDENT_AMBULATORY_CARE_PROVIDER_SITE_OTHER): Payer: Medicare Other | Admitting: Psychiatry

## 2014-12-05 VITALS — BP 147/100 | HR 95 | Ht 63.0 in | Wt 96.4 lb

## 2014-12-05 DIAGNOSIS — F411 Generalized anxiety disorder: Secondary | ICD-10-CM | POA: Diagnosis not present

## 2014-12-05 MED ORDER — ESCITALOPRAM OXALATE 10 MG PO TABS: 10.0000 mg | ORAL_TABLET | Freq: Every day | ORAL | Status: DC

## 2014-12-05 MED ORDER — HYDROXYZINE HCL 25 MG PO TABS: 25.0000 mg | ORAL_TABLET | Freq: Every day | ORAL | Status: DC
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
## 2014-12-06 ENCOUNTER — Telehealth (HOSPITAL_COMMUNITY): Payer: Self-pay | Admitting: *Deleted

## 2014-12-18 ENCOUNTER — Ambulatory Visit (HOSPITAL_COMMUNITY): Payer: Self-pay | Admitting: Psychiatry

## 2015-01-08 ENCOUNTER — Ambulatory Visit (HOSPITAL_COMMUNITY): Payer: Self-pay | Admitting: Psychiatry

## 2015-01-13 ENCOUNTER — Telehealth (HOSPITAL_COMMUNITY): Payer: Self-pay | Admitting: *Deleted

## 2015-01-13 NOTE — Telephone Encounter (Signed)
Prior authorization for Lexapro received. Submitted request online with cover my meds. Upon submitting received error message, called and was told they are running slow and would call when fixed. Had to re-enter authorization once website began working. Awaiting decision to be faxed to office.

## 2015-01-14 NOTE — Telephone Encounter (Signed)
Noted. Pt approved

## 2015-03-07 ENCOUNTER — Encounter (HOSPITAL_COMMUNITY): Payer: Self-pay | Admitting: *Deleted

## 2015-03-07 ENCOUNTER — Ambulatory Visit (HOSPITAL_COMMUNITY): Payer: Self-pay | Admitting: Psychiatry

## 2015-06-12 ENCOUNTER — Ambulatory Visit (HOSPITAL_COMMUNITY): Payer: Self-pay | Admitting: Psychiatry

## 2015-06-24 ENCOUNTER — Ambulatory Visit (INDEPENDENT_AMBULATORY_CARE_PROVIDER_SITE_OTHER): Payer: Medicare Other | Admitting: Psychiatry

## 2015-06-24 ENCOUNTER — Encounter (HOSPITAL_COMMUNITY): Payer: Self-pay | Admitting: Psychiatry

## 2015-06-24 VITALS — BP 163/115 | HR 93 | Ht 63.0 in | Wt 98.0 lb

## 2015-06-24 DIAGNOSIS — F411 Generalized anxiety disorder: Secondary | ICD-10-CM

## 2015-06-24 MED ORDER — ESCITALOPRAM OXALATE 10 MG PO TABS: 10.0000 mg | ORAL_TABLET | Freq: Every day | ORAL | Status: DC

## 2015-06-24 MED ORDER — HYDROXYZINE HCL 25 MG PO TABS: 25.0000 mg | ORAL_TABLET | Freq: Every day | ORAL | Status: DC
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
## 2015-09-18 ENCOUNTER — Ambulatory Visit (INDEPENDENT_AMBULATORY_CARE_PROVIDER_SITE_OTHER): Payer: Medicare Other | Admitting: Psychiatry

## 2015-09-18 ENCOUNTER — Encounter (HOSPITAL_COMMUNITY): Payer: Self-pay | Admitting: Psychiatry

## 2015-09-18 VITALS — BP 146/103 | HR 89 | Ht 63.0 in | Wt 99.4 lb

## 2015-09-18 DIAGNOSIS — F411 Generalized anxiety disorder: Secondary | ICD-10-CM | POA: Diagnosis not present

## 2015-09-18 MED ORDER — HYDROXYZINE HCL 25 MG PO TABS: 25.0000 mg | ORAL_TABLET | Freq: Every day | ORAL | 2 refills | Status: DC

## 2015-09-18 MED ORDER — ESCITALOPRAM OXALATE 10 MG PO TABS: 10.0000 mg | ORAL_TABLET | Freq: Every day | ORAL | 2 refills | Status: DC
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
## 2015-09-24 ENCOUNTER — Ambulatory Visit (HOSPITAL_COMMUNITY): Payer: Self-pay | Admitting: Psychiatry

## 2015-12-08 ENCOUNTER — Telehealth (HOSPITAL_COMMUNITY): Payer: Self-pay | Admitting: *Deleted

## 2015-12-08 ENCOUNTER — Ambulatory Visit (HOSPITAL_COMMUNITY): Payer: Self-pay | Admitting: Psychiatry

## 2015-12-08 NOTE — Telephone Encounter (Signed)
Pt returning office call due to needing to resch appt due to provider being out of office. Tried to give pt sooner a ppt but pt stated she can ONLY come in the afternoon after 3:30pm due to having to take a friend to work. 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 Informed pt message will be provided to provider. Pt number is [phone]. Pt will be out of her mediations on 12-19-2015.

## 2015-12-08 NOTE — Telephone Encounter (Signed)
Called pt to resch appt for Nov 6th due to provider being out of office. was speaking with pt and phone got disconnected. called pt back and lmtcb and office number provided

## 2015-12-09 ENCOUNTER — Other Ambulatory Visit (HOSPITAL_COMMUNITY): Payer: Self-pay | Admitting: Psychiatry

## 2015-12-09 MED ORDER — ESCITALOPRAM OXALATE 10 MG PO TABS: 10.0000 mg | ORAL_TABLET | Freq: Every day | ORAL | 2 refills | Status: DC

## 2015-12-09 MED ORDER — HYDROXYZINE HCL 25 MG PO TABS: 25.0000 mg | ORAL_TABLET | Freq: Every day | ORAL | 2 refills | Status: DC

## 2015-12-09 NOTE — Telephone Encounter (Signed)
Noted. Pt is sch for Dec 6th

## 2015-12-09 NOTE — Telephone Encounter (Signed)
meds sent in. Please try to reschedule

## 2015-12-19 ENCOUNTER — Ambulatory Visit (HOSPITAL_COMMUNITY): Payer: Self-pay | Admitting: Psychiatry

## 2016-01-05 ENCOUNTER — Telehealth (HOSPITAL_COMMUNITY): Payer: Self-pay | Admitting: *Deleted

## 2016-01-05 NOTE — Telephone Encounter (Signed)
RETURNED PHONE CALL, NO ANSWER, LEFT VOICE MESSAGE.

## 2016-01-07 ENCOUNTER — Ambulatory Visit (INDEPENDENT_AMBULATORY_CARE_PROVIDER_SITE_OTHER): Payer: Medicare Other | Admitting: Psychiatry

## 2016-01-07 ENCOUNTER — Encounter (HOSPITAL_COMMUNITY): Payer: Self-pay | Admitting: Psychiatry

## 2016-01-07 VITALS — BP 160/98 | Ht 63.0 in | Wt 98.0 lb

## 2016-01-07 DIAGNOSIS — F17210 Nicotine dependence, cigarettes, uncomplicated: Secondary | ICD-10-CM

## 2016-01-07 DIAGNOSIS — F411 Generalized anxiety disorder: Secondary | ICD-10-CM

## 2016-01-07 MED ORDER — ESCITALOPRAM OXALATE 10 MG PO TABS: 10.0000 mg | ORAL_TABLET | Freq: Every day | ORAL | 2 refills | Status: DC

## 2016-01-07 MED ORDER — TRAZODONE HCL 50 MG PO TABS: 50.0000 mg | ORAL_TABLET | Freq: Every day | ORAL | 2 refills | Status: DC
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
## 2016-02-03 DIAGNOSIS — Z1389 Encounter for screening for other disorder: Secondary | ICD-10-CM | POA: Diagnosis not present

## 2016-02-03 DIAGNOSIS — Z0000 Encounter for general adult medical examination without abnormal findings: Secondary | ICD-10-CM | POA: Diagnosis not present

## 2016-02-10 DIAGNOSIS — I10 Essential (primary) hypertension: Secondary | ICD-10-CM | POA: Diagnosis not present

## 2016-02-10 DIAGNOSIS — G44209 Tension-type headache, unspecified, not intractable: Secondary | ICD-10-CM | POA: Diagnosis not present

## 2016-02-10 DIAGNOSIS — Z1389 Encounter for screening for other disorder: Secondary | ICD-10-CM | POA: Diagnosis not present

## 2016-02-10 DIAGNOSIS — F329 Major depressive disorder, single episode, unspecified: Secondary | ICD-10-CM | POA: Diagnosis not present

## 2016-02-10 DIAGNOSIS — F17200 Nicotine dependence, unspecified, uncomplicated: Secondary | ICD-10-CM | POA: Diagnosis not present

## 2016-04-06 ENCOUNTER — Encounter (HOSPITAL_COMMUNITY): Payer: Self-pay | Admitting: Psychiatry

## 2016-04-06 ENCOUNTER — Ambulatory Visit (INDEPENDENT_AMBULATORY_CARE_PROVIDER_SITE_OTHER): Payer: Medicare Other | Admitting: Psychiatry

## 2016-04-06 VITALS — BP 120/82 | HR 100 | Ht 63.0 in | Wt 97.4 lb

## 2016-04-06 DIAGNOSIS — Z888 Allergy status to other drugs, medicaments and biological substances status: Secondary | ICD-10-CM

## 2016-04-06 DIAGNOSIS — F411 Generalized anxiety disorder: Secondary | ICD-10-CM

## 2016-04-06 DIAGNOSIS — Z79899 Other long term (current) drug therapy: Secondary | ICD-10-CM | POA: Diagnosis not present

## 2016-04-06 DIAGNOSIS — Z87891 Personal history of nicotine dependence: Secondary | ICD-10-CM | POA: Diagnosis not present

## 2016-04-06 MED ORDER — TRAZODONE HCL 50 MG PO TABS: 50.0000 mg | ORAL_TABLET | Freq: Every day | ORAL | 2 refills | Status: DC

## 2016-04-06 MED ORDER — ESCITALOPRAM OXALATE 10 MG PO TABS: 10.0000 mg | ORAL_TABLET | Freq: Every day | ORAL | 2 refills | Status: DC
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
## 2016-06-08 DIAGNOSIS — F17200 Nicotine dependence, unspecified, uncomplicated: Secondary | ICD-10-CM | POA: Diagnosis not present

## 2016-06-08 DIAGNOSIS — F419 Anxiety disorder, unspecified: Secondary | ICD-10-CM | POA: Diagnosis not present

## 2016-06-08 DIAGNOSIS — I10 Essential (primary) hypertension: Secondary | ICD-10-CM | POA: Diagnosis not present

## 2016-06-08 DIAGNOSIS — J302 Other seasonal allergic rhinitis: Secondary | ICD-10-CM | POA: Diagnosis not present

## 2016-07-07 ENCOUNTER — Ambulatory Visit (HOSPITAL_COMMUNITY): Payer: Medicare Other | Admitting: Psychiatry

## 2016-08-05 ENCOUNTER — Ambulatory Visit (HOSPITAL_COMMUNITY): Payer: Self-pay | Admitting: Psychiatry

## 2016-09-07 ENCOUNTER — Ambulatory Visit (INDEPENDENT_AMBULATORY_CARE_PROVIDER_SITE_OTHER): Payer: Medicare Other | Admitting: Psychiatry

## 2016-09-07 ENCOUNTER — Encounter (HOSPITAL_COMMUNITY): Payer: Self-pay | Admitting: Psychiatry

## 2016-09-07 VITALS — BP 128/84 | HR 85 | Ht 63.0 in | Wt 88.6 lb

## 2016-09-07 DIAGNOSIS — F17210 Nicotine dependence, cigarettes, uncomplicated: Secondary | ICD-10-CM | POA: Diagnosis not present

## 2016-09-07 DIAGNOSIS — F411 Generalized anxiety disorder: Secondary | ICD-10-CM | POA: Diagnosis not present

## 2016-09-07 DIAGNOSIS — G4700 Insomnia, unspecified: Secondary | ICD-10-CM

## 2016-09-07 MED ORDER — TRAZODONE HCL 50 MG PO TABS: 50.0000 mg | ORAL_TABLET | Freq: Every day | ORAL | 2 refills | Status: AC

## 2016-09-07 MED ORDER — ESCITALOPRAM OXALATE 10 MG PO TABS: 10.0000 mg | ORAL_TABLET | Freq: Every day | ORAL | 2 refills | Status: AC
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
## 2016-09-07 NOTE — Patient Instructions (Signed)
Eat 3 meals a day and 2 snacks Add carnation instant breakfast twice a day

## 2016-10-19 ENCOUNTER — Telehealth (HOSPITAL_COMMUNITY): Payer: Self-pay | Admitting: *Deleted

## 2016-10-19 NOTE — Telephone Encounter (Signed)
returned phone call, left voice message. 

## 2016-10-28 ENCOUNTER — Ambulatory Visit (HOSPITAL_COMMUNITY): Payer: Medicare Other | Admitting: Psychiatry

## 2017-03-08 DIAGNOSIS — Z0000 Encounter for general adult medical examination without abnormal findings: Secondary | ICD-10-CM | POA: Diagnosis not present

## 2017-03-08 DIAGNOSIS — Z1389 Encounter for screening for other disorder: Secondary | ICD-10-CM | POA: Diagnosis not present

## 2017-03-14 ENCOUNTER — Other Ambulatory Visit (HOSPITAL_COMMUNITY): Payer: Self-pay | Admitting: Psychiatry

## 2017-05-09 DIAGNOSIS — I10 Essential (primary) hypertension: Secondary | ICD-10-CM | POA: Diagnosis not present

## 2017-05-09 DIAGNOSIS — Z681 Body mass index (BMI) 19 or less, adult: Secondary | ICD-10-CM | POA: Diagnosis not present

## 2017-05-09 DIAGNOSIS — F5101 Primary insomnia: Secondary | ICD-10-CM | POA: Diagnosis not present

## 2017-05-09 DIAGNOSIS — K219 Gastro-esophageal reflux disease without esophagitis: Secondary | ICD-10-CM | POA: Diagnosis not present

## 2017-05-09 DIAGNOSIS — F419 Anxiety disorder, unspecified: Secondary | ICD-10-CM | POA: Diagnosis not present

## 2017-05-09 DIAGNOSIS — F39 Unspecified mood [affective] disorder: Secondary | ICD-10-CM | POA: Diagnosis not present

## 2017-05-10 ENCOUNTER — Other Ambulatory Visit (HOSPITAL_COMMUNITY): Payer: Self-pay | Admitting: Psychiatry

## 2017-05-10 ENCOUNTER — Ambulatory Visit (HOSPITAL_COMMUNITY): Payer: Self-pay | Admitting: Psychiatry

## 2017-05-24 DIAGNOSIS — R600 Localized edema: Secondary | ICD-10-CM | POA: Diagnosis not present

## 2017-05-24 DIAGNOSIS — I10 Essential (primary) hypertension: Secondary | ICD-10-CM | POA: Diagnosis not present

## 2017-05-24 DIAGNOSIS — N898 Other specified noninflammatory disorders of vagina: Secondary | ICD-10-CM | POA: Diagnosis not present

## 2017-06-20 DIAGNOSIS — H60501 Unspecified acute noninfective otitis externa, right ear: Secondary | ICD-10-CM | POA: Diagnosis not present

## 2017-08-08 DIAGNOSIS — I10 Essential (primary) hypertension: Secondary | ICD-10-CM | POA: Diagnosis not present

## 2017-08-08 DIAGNOSIS — Z0000 Encounter for general adult medical examination without abnormal findings: Secondary | ICD-10-CM | POA: Diagnosis not present

## 2017-08-17 ENCOUNTER — Encounter (HOSPITAL_COMMUNITY): Payer: Self-pay

## 2017-08-17 ENCOUNTER — Emergency Department (HOSPITAL_COMMUNITY)
Admission: EM | Admit: 2017-08-17 | Discharge: 2017-08-17 | Disposition: A | Discharge status: Home / Self Care | Payer: Medicare Other | Attending: Emergency Medicine | Admitting: Emergency Medicine

## 2017-08-17 DIAGNOSIS — T24011A Burn of unspecified degree of right thigh, initial encounter: Secondary | ICD-10-CM | POA: Diagnosis present

## 2017-08-17 DIAGNOSIS — F17210 Nicotine dependence, cigarettes, uncomplicated: Secondary | ICD-10-CM | POA: Diagnosis not present

## 2017-08-17 DIAGNOSIS — Y939 Activity, unspecified: Secondary | ICD-10-CM | POA: Insufficient documentation

## 2017-08-17 DIAGNOSIS — Z79899 Other long term (current) drug therapy: Secondary | ICD-10-CM | POA: Diagnosis not present

## 2017-08-17 DIAGNOSIS — Y92000 Kitchen of unspecified non-institutional (private) residence as  the place of occurrence of the external cause: Secondary | ICD-10-CM | POA: Insufficient documentation

## 2017-08-17 DIAGNOSIS — I10 Essential (primary) hypertension: Secondary | ICD-10-CM | POA: Diagnosis not present

## 2017-08-17 DIAGNOSIS — Y999 Unspecified external cause status: Secondary | ICD-10-CM | POA: Diagnosis not present

## 2017-08-17 DIAGNOSIS — T24111A Burn of first degree of right thigh, initial encounter: Secondary | ICD-10-CM | POA: Diagnosis not present

## 2017-08-17 DIAGNOSIS — X118XXA Contact with other hot tap-water, initial encounter: Secondary | ICD-10-CM | POA: Insufficient documentation

## 2017-08-17 HISTORY — DX: Insomnia, unspecified: G47.00

## 2017-08-17 MED ORDER — BACITRACIN-NEOMYCIN-POLYMYXIN 400-5-5000 EX OINT
TOPICAL_OINTMENT | Freq: Once | CUTANEOUS | Status: AC
  Administered 2017-08-17: 1 via TOPICAL
  Filled 2017-08-17: qty 1
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
## 2017-09-12 DIAGNOSIS — Z712 Person consulting for explanation of examination or test findings: Secondary | ICD-10-CM | POA: Diagnosis not present

## 2017-09-12 DIAGNOSIS — Z720 Tobacco use: Secondary | ICD-10-CM | POA: Diagnosis not present

## 2017-09-12 DIAGNOSIS — Z681 Body mass index (BMI) 19 or less, adult: Secondary | ICD-10-CM | POA: Diagnosis not present

## 2017-09-12 DIAGNOSIS — F331 Major depressive disorder, recurrent, moderate: Secondary | ICD-10-CM | POA: Diagnosis not present

## 2017-09-12 DIAGNOSIS — F419 Anxiety disorder, unspecified: Secondary | ICD-10-CM | POA: Diagnosis not present

## 2017-09-12 DIAGNOSIS — R7301 Impaired fasting glucose: Secondary | ICD-10-CM | POA: Diagnosis not present

## 2017-10-11 DIAGNOSIS — R7301 Impaired fasting glucose: Secondary | ICD-10-CM | POA: Diagnosis not present

## 2017-10-11 DIAGNOSIS — Z681 Body mass index (BMI) 19 or less, adult: Secondary | ICD-10-CM | POA: Diagnosis not present

## 2017-10-11 DIAGNOSIS — F331 Major depressive disorder, recurrent, moderate: Secondary | ICD-10-CM | POA: Diagnosis not present

## 2017-10-11 DIAGNOSIS — F39 Unspecified mood [affective] disorder: Secondary | ICD-10-CM | POA: Diagnosis not present

## 2017-10-11 DIAGNOSIS — F5101 Primary insomnia: Secondary | ICD-10-CM | POA: Diagnosis not present

## 2017-10-11 DIAGNOSIS — I10 Essential (primary) hypertension: Secondary | ICD-10-CM | POA: Diagnosis not present

## 2017-10-11 DIAGNOSIS — R51 Headache: Secondary | ICD-10-CM | POA: Diagnosis not present

## 2017-10-11 DIAGNOSIS — H60501 Unspecified acute noninfective otitis externa, right ear: Secondary | ICD-10-CM | POA: Diagnosis not present

## 2017-10-11 DIAGNOSIS — F419 Anxiety disorder, unspecified: Secondary | ICD-10-CM | POA: Diagnosis not present

## 2017-10-11 DIAGNOSIS — K219 Gastro-esophageal reflux disease without esophagitis: Secondary | ICD-10-CM | POA: Diagnosis not present

## 2017-10-11 DIAGNOSIS — N898 Other specified noninflammatory disorders of vagina: Secondary | ICD-10-CM | POA: Diagnosis not present

## 2017-10-11 DIAGNOSIS — Z720 Tobacco use: Secondary | ICD-10-CM | POA: Diagnosis not present

## 2017-10-11 DIAGNOSIS — R600 Localized edema: Secondary | ICD-10-CM | POA: Diagnosis not present

## 2017-12-16 DIAGNOSIS — Z23 Encounter for immunization: Secondary | ICD-10-CM | POA: Diagnosis not present

## 2018-01-15 ENCOUNTER — Encounter (HOSPITAL_COMMUNITY): Payer: Self-pay

## 2018-01-15 ENCOUNTER — Other Ambulatory Visit: Payer: Self-pay

## 2018-01-15 ENCOUNTER — Emergency Department (HOSPITAL_COMMUNITY)
Admission: EM | Admit: 2018-01-15 | Discharge: 2018-01-15 | Disposition: A | Discharge status: Home / Self Care | Payer: Medicare Other | Attending: Emergency Medicine | Admitting: Emergency Medicine

## 2018-01-15 ENCOUNTER — Emergency Department (HOSPITAL_COMMUNITY): Payer: Medicare Other

## 2018-01-15 DIAGNOSIS — M7989 Other specified soft tissue disorders: Secondary | ICD-10-CM | POA: Diagnosis not present

## 2018-01-15 DIAGNOSIS — M79671 Pain in right foot: Secondary | ICD-10-CM | POA: Diagnosis not present

## 2018-01-15 DIAGNOSIS — Z79899 Other long term (current) drug therapy: Secondary | ICD-10-CM | POA: Diagnosis not present

## 2018-01-15 DIAGNOSIS — I10 Essential (primary) hypertension: Secondary | ICD-10-CM | POA: Diagnosis not present

## 2018-01-15 DIAGNOSIS — F17210 Nicotine dependence, cigarettes, uncomplicated: Secondary | ICD-10-CM | POA: Diagnosis not present

## 2018-01-15 DIAGNOSIS — R52 Pain, unspecified: Secondary | ICD-10-CM

## 2018-01-15 MED ORDER — ACETAMINOPHEN 325 MG PO TABS: 325.0000 mg | ORAL_TABLET | Freq: Four times a day (QID) | ORAL | 0 refills | Status: AC | PRN

## 2018-01-15 NOTE — ED Notes (Signed)
Awaiting eval  

## 2018-01-15 NOTE — ED Notes (Signed)
PA in to discuss findings and reeval

## 2018-01-15 NOTE — ED Triage Notes (Signed)
Pt reports she has swelling and a knot on the top of her foot for one month  No injury reported. States hasnt been able to get in touch w Dr Margo AyeHall

## 2018-01-15 NOTE — ED Notes (Signed)
PA in to assess 

## 2018-01-15 NOTE — ED Notes (Signed)
Awaiting rad read 

## 2018-01-15 NOTE — ED Notes (Signed)
Pt reports a knot on the top of her foot for one month Reports it is painful and unrelieved by tylenol States she has not had time to see her PCP   The knot is no worse today she had time to come and get checked.  Foot with a small raised area. It is not red, not warm to touch

## 2018-01-15 NOTE — ED Notes (Signed)
Pt refuses urine preg test  Reports a hysterectomy

## 2018-01-15 NOTE — Discharge Instructions (Addendum)
You have been seen today for foot pain. Please read and follow all provided instructions.   1. Medications: ibuprofen, usual home medications 2. Treatment: rest, drink plenty of fluids 3. Follow Up: Please follow up with your primary doctor in 7 days for discussion of your diagnoses and further evaluation after today's visit; if you do not have a primary care doctor use the resource guide provided to find one; Please return to the ER for any new or worsening symptoms. Please obtain all of your results from medical records or have your doctors office obtain the results - share them with your doctor - you should be seen at your doctors office. Call today to arrange your follow up.   Take medications as prescribed. Please review all of the medicines and only take them if you do not have an allergy to them. Return to the emergency room for worsening condition or new concerning symptoms. Follow up with your regular doctor. If you don't have a regular doctor use one of the numbers below to establish a primary care doctor.  Please be aware that if you are taking birth control pills, taking other prescriptions, ESPECIALLY ANTIBIOTICS may make the birth control ineffective - if this is the case, either do not engage in sexual activity or use alternative methods of birth control such as condoms until you have finished the medicine and your family doctor says it is OK to restart them. If you are on a blood thinner such as COUMADIN, be aware that any other medicine that you take may cause the coumadin to either work too much, or not enough - you should have your coumadin level rechecked in next 7 days if this is the case.  ?  It is also a possibility that you have an allergic reaction to any of the medicines that you have been prescribed - Everybody reacts differently to medications and while MOST people have no trouble with most medicines, you may have a reaction such as nausea, vomiting, rash, swelling, shortness of  breath. If this is the case, please stop taking the medicine immediately and contact your physician.  ?  You should return to the ER if you develop severe or worsening symptoms.   Emergency Department Resource Guide 1) Find a Doctor and Pay Out of Pocket Although you won't have to find out who is covered by your insurance plan, it is a good idea to ask around and get recommendations. You will then need to call the office and see if the doctor you have chosen will accept you as a new patient and what types of options they offer for patients who are self-pay. Some doctors offer discounts or will set up payment plans for their patients who do not have insurance, but you will need to ask so you aren't surprised when you get to your appointment.  2) Contact Your Local Health Department Not all health departments have doctors that can see patients for sick visits, but many do, so it is worth a call to see if yours does. If you don't know where your local health department is, you can check in your phone book. The CDC also has a tool to help you locate your state's health department, and many state websites also have listings of all of their local health departments.  3) Find a Walk-in Clinic If your illness is not likely to be very severe or complicated, you may want to try a walk in clinic. These are popping up all  over the country in pharmacies, drugstores, and shopping centers. They're usually staffed by nurse practitioners or physician assistants that have been trained to treat common illnesses and complaints. They're usually fairly quick and inexpensive. However, if you have serious medical issues or chronic medical problems, these are probably not your best option.  No Primary Care Doctor: Call Health Connect at  [phone] - they can help you locate a primary care doctor that  accepts your insurance, provides certain services, etc. Physician Referral [phone]  Emergency Department  Resource Guide 1) Find a Doctor and Pay Out of Pocket Although you won't have to find out who is covered by your insurance plan, it is a good idea to ask around and get recommendations. You will then need to call the office and see if the doctor you have chosen will accept you as a new patient and what types of options they offer for patients who are self-pay. Some doctors offer discounts or will set up payment plans for their patients who do not have insurance, but you will need to ask so you aren't surprised when you get to your appointment.  2) Contact Your Local Health Department Not all health departments have doctors that can see patients for sick visits, but many do, so it is worth a call to see if yours does. If you don't know where your local health department is, you can check in your phone book. The CDC also has a tool to help you locate your state's health department, and many state websites also have listings of all of their local health departments.  3) Find a [facility] If your illness is not likely to be very severe or complicated, you may want to try a walk in clinic. These are popping up all over the country in pharmacies, drugstores, and shopping centers. They're usually staffed by nurse practitioners or physician assistants that have been trained to treat common illnesses and complaints. They're usually fairly quick and inexpensive. However, if you have serious medical issues or chronic medical problems, these are probably not your best option.  No Primary Care Doctor: Call Health Connect at  [phone] - they can help you locate a primary care doctor that  accepts your insurance, provides certain services, etc. Physician Referral Service- [phone]  Chronic Pain Problems: Organization         Address  Phone   Notes  [facility]  [phone] Patients need to be referred by their primary care doctor.   Medication Assistance: Organization          Address  Phone   Notes  [facility] [address] [place], [place] [address] [phone] --Must be a resident of [facility] -- Must have NO insurance coverage whatsoever (no Medicaid/ Medicare, etc.) -- The pt. MUST have a primary care doctor that directs their care regularly and follows them in the community   [facility]  [phone]   [employer]  [phone]    Agencies that provide inexpensive medical care: Organization         Address  Phone   Notes  [place]  [phone]   [facility]    [phone]   [facility] [place], [place] [address] [phone]   [place] [address] [place] [phone]   [facility]anned Parenthood    [phone]  [facility]    [phone]   [facility] and [facility]  [address], [place] Phone:  [phone], Fax:  [phone] Hours of Operation:  9 am - 6 pm, M-F.  Also accepts Medicaid/Medicare and self-pay.  [facility] for [place] [address], [address], [place] Phone: [phone], Fax: [phone]. Hours of Operation:  8:30 am - 5:30 pm, M-F.  Also accepts Medicaid and self-pay.  [facility] [address] [place] Phone: [phone]   [place], [place], [place] [phone], Ext. [phone] Mondays & Thursdays: 7-9 AM.  First 15 patients are seen on a first come, first serve basis.    [place] Providers:  Organization         Address  Phone   Notes  [facility] [address], [place] [phone] Also accepts self-pay patients.  [facility] [address], [place]  [phone]   [place], [address], [place] [phone]   [facility] [address] [place] [phone]   Lucianne Lei [address], [place]   [phone] Only accepts [place] [facility] [place] patients after they have their name applied to their card.   Self-Pay (no insurance) in [facility]:  Organization         Address  Phone   Notes  Sickle Cell Patients, [facility] Internal Medicine Protection [phone]   [facility] [place] [phone]   [facility] [place]  [place], [place], [place] [phone]   [facility]/Dr. Osei-Bonsu  [address] [place] or [address], [place] [phone] Phone number for both [place] and [place] locations is the same.  Urgent Medical and [facility] [address] [place] [phone]   [facility] [address], [place] or [address] [phone]   [facility] [address], [place] [phone], phone; [phone], fax Sees patients 1st and 3rd Saturday of every month.  Must not qualify for public or private insurance (i.e. Medicaid, Medicare,  Health Choice, Veterans' Benefits)  Household income should be no more than 200% of the poverty level The clinic cannot treat you if you are pregnant or think you are pregnant  Sexually transmitted diseases are not treated at the clinic.

## 2018-01-15 NOTE — ED Provider Notes (Signed)
[facility] EMERGENCY DEPARTMENT Provider Note   CSN: [id number] Arrival date & time: 01/15/18  1120     History   Chief Complaint Chief Complaint  Patient presents with  . Foot Pain    HPI Christina Fernandez is a 47 y.o. female presents with right dorsal foot pain onset 1 month ago. Patient describes pain as throbbing and states it is worse with walking and putting on shoes. Patient states tylenol has alleviated some of her pain. Patient reports mild edema on right foot, but denies any injury, fever, night sweats, or chills. Patient denies any ankle pain. Patient states she has tried to contact her PCP, but has been unable to set up an appointment.   HPI  Past Medical History:  Diagnosis Date  . Acid reflux   . Hypertension   . Insomnia   . Muscle spasms of lower extremity   . Panic attacks     There are no active problems to display for this patient.   Past Surgical History:  Procedure Laterality Date  . ABDOMINAL HYSTERECTOMY    . PARTIAL HYSTERECTOMY       OB History   No obstetric history on file.      Home Medications    Prior to Admission medications   Medication Sig Start Date End Date Taking? Authorizing Provider  acetaminophen (TYLENOL) 325 MG tablet Take 1 tablet (325 mg total) by mouth every 6 (six) hours as needed for up to 7 days for moderate pain. 01/15/18 01/22/18  Carlyle BasquesHernandez, Shakyia Bosso P, PA-C  cyclobenzaprine (FLEXERIL) 10 MG tablet Take 10 mg by mouth 2 (two) times daily as needed for muscle spasms.  12/05/13   [provider]  escitalopram (LEXAPRO) 10 MG tablet Take 1 tablet (10 mg total) by mouth daily. 09/07/16 09/07/17  Myrlene Brokeross, Deborah R, MD  ramipril (ALTACE) 2.5 MG capsule Take 2.5 mg by mouth daily. 11/09/13   [provider]  ranitidine (ZANTAC) 150 MG tablet Take 150 mg by mouth 2 (two) times daily.  08/13/14   [provider]  traZODone (DESYREL) 50 MG tablet Take 1 tablet (50 mg total) by mouth at bedtime. 09/07/16   Myrlene Brokeross,  Deborah R, MD    Family History History reviewed. No pertinent family history.  Social History Social History   Tobacco Use  . Smoking status: Heavy Tobacco Smoker    Packs/day: 0.50    Types: Cigarettes  . Smokeless tobacco: Never Used  Substance Use Topics  . Alcohol use: Yes    Comment: occ  . Drug use: No     Allergies   Aspirin   Review of Systems Review of Systems  Constitutional: Negative for chills, diaphoresis and fever.  Respiratory: Negative for cough and shortness of breath.   Cardiovascular: Negative for chest pain.  Gastrointestinal: Negative for abdominal pain, nausea and vomiting.  Endocrine: Negative for cold intolerance and heat intolerance.  Genitourinary: Negative for dysuria.  Musculoskeletal: Positive for arthralgias and gait problem. Negative for back pain.  Skin: Negative for color change and rash.  Allergic/Immunologic: Negative for immunocompromised state.  Hematological: Negative for adenopathy.     Physical Exam Updated Vital Signs BP 113/78 (BP Location: Right Arm)   Pulse 78   Temp 97.9 F (36.6 C) (Oral)   Resp 16   Wt 45.4 kg   SpO2 99%   BMI 17.71 kg/m   Physical Exam Vitals signs and nursing note reviewed.  Constitutional:      General: She is not  in acute distress.    Appearance: She is well-developed. She is not diaphoretic.  HENT:     Head: Normocephalic and atraumatic.  Cardiovascular:     Rate and Rhythm: Normal rate and regular rhythm.     Heart sounds: Normal heart sounds. No murmur. No friction rub. No gallop.   Pulmonary:     Effort: Pulmonary effort is normal. No respiratory distress.     Breath sounds: Normal breath sounds. No wheezing or rales.  Abdominal:     Palpations: Abdomen is soft.     Tenderness: There is no abdominal tenderness.  Musculoskeletal: Normal range of motion.     Right ankle: Normal. She exhibits normal range of motion, no swelling and no ecchymosis.     Left ankle: Normal. She  exhibits normal range of motion, no swelling and no ecchymosis.       Feet:  Skin:    Findings: No erythema or rash.  Neurological:     Mental Status: She is alert and oriented to person, place, and time.      ED Treatments / Results  Labs (all labs ordered are listed, but only abnormal results are displayed) Labs Reviewed - No data to display  EKG None  Radiology Dg Foot Complete Right  Result Date: 01/15/2018 CLINICAL DATA:  Right foot swelling for 1 month without known injury. EXAM: RIGHT FOOT COMPLETE - 3+ VIEW COMPARISON:  Radiographs of June 01, 2007. FINDINGS: There is no evidence of fracture or dislocation. There is no evidence of arthropathy or other focal bone abnormality. Soft tissues are unremarkable. IMPRESSION: Negative. Electronically Signed   By: Lupita Raider, M.D.   On: 01/15/2018 13:55    Procedures Procedures (including critical care time)  Medications Ordered in ED Medications - No data to display   Initial Impression / Assessment and Plan / ED Course  I have reviewed the triage vital signs and the nursing notes.  Pertinent labs & imaging results that were available during my care of the patient were reviewed by me and considered in my medical decision making (see chart for details).  Clinical Course as of Jan 16 1428  Sun Jan 15, 2018  1407 Foot x ray is negative.   DG Foot Complete Right [AH]    Clinical Course User Index [AH] Leretha Dykes, PA-C   Patient presents with complaint of foot pain. Patient nontoxic appearing, in no apparent distress, vitals WNL, stable.   Assessment/Plan: No acute bony abnormality noted on x ray. Patient is ambulating without difficulty. Will prescribe tylenol for pain. Advised patient to follow up with PCP and orthopedics if pain continues. Doubt need for further emergent work up at this time. I discussed results, treatment plan, need for PCP follow-up, and return precautions to return to the ER including for  any other new or worsening symptoms with the patient. Provided opportunity for questions, patient confirmed understanding and is in agreement with plan. I have answered their questions. Discharge instructions concerning home care and prescriptions have been given. The patient is STABLE and is discharged to home in good condition. Encouraged patient to follow up with PCP and have PCP obtain results of this visit in 3 or sooner if needed.    Final Clinical Impressions(s) / ED Diagnoses   Final diagnoses:  Foot pain, right    ED Discharge Orders         Ordered    acetaminophen (TYLENOL) 325 MG tablet  Every 6 hours PRN  01/15/18 1428           Leretha Dykes, PA-C 01/15/18 1429    Vanetta Mulders, MD 01/18/18 1050

## 2018-01-18 DIAGNOSIS — M79673 Pain in unspecified foot: Secondary | ICD-10-CM | POA: Diagnosis not present

## 2018-03-16 DIAGNOSIS — R7301 Impaired fasting glucose: Secondary | ICD-10-CM | POA: Diagnosis not present

## 2018-03-16 DIAGNOSIS — I10 Essential (primary) hypertension: Secondary | ICD-10-CM | POA: Diagnosis not present

## 2018-03-21 DIAGNOSIS — F419 Anxiety disorder, unspecified: Secondary | ICD-10-CM | POA: Diagnosis not present

## 2018-03-21 DIAGNOSIS — J060 Acute laryngopharyngitis: Secondary | ICD-10-CM | POA: Diagnosis not present

## 2018-03-21 DIAGNOSIS — R7301 Impaired fasting glucose: Secondary | ICD-10-CM | POA: Diagnosis not present

## 2018-03-21 DIAGNOSIS — Z720 Tobacco use: Secondary | ICD-10-CM | POA: Diagnosis not present

## 2018-03-21 DIAGNOSIS — F331 Major depressive disorder, recurrent, moderate: Secondary | ICD-10-CM | POA: Diagnosis not present

## 2018-03-21 DIAGNOSIS — K219 Gastro-esophageal reflux disease without esophagitis: Secondary | ICD-10-CM | POA: Diagnosis not present

## 2018-09-19 DIAGNOSIS — F419 Anxiety disorder, unspecified: Secondary | ICD-10-CM | POA: Diagnosis not present

## 2018-09-19 DIAGNOSIS — I10 Essential (primary) hypertension: Secondary | ICD-10-CM | POA: Diagnosis not present

## 2018-09-19 DIAGNOSIS — K219 Gastro-esophageal reflux disease without esophagitis: Secondary | ICD-10-CM | POA: Diagnosis not present

## 2018-09-19 DIAGNOSIS — J302 Other seasonal allergic rhinitis: Secondary | ICD-10-CM | POA: Diagnosis not present

## 2018-09-19 DIAGNOSIS — G4700 Insomnia, unspecified: Secondary | ICD-10-CM | POA: Diagnosis not present

## 2018-10-18 ENCOUNTER — Other Ambulatory Visit: Payer: Self-pay

## 2018-10-18 ENCOUNTER — Emergency Department (HOSPITAL_COMMUNITY): Payer: No Typology Code available for payment source

## 2018-10-18 ENCOUNTER — Encounter (HOSPITAL_COMMUNITY): Payer: Self-pay

## 2018-10-18 ENCOUNTER — Emergency Department (HOSPITAL_COMMUNITY)
Admission: EM | Admit: 2018-10-18 | Discharge: 2018-10-18 | Disposition: A | Discharge status: Home / Self Care | Payer: No Typology Code available for payment source | Attending: Emergency Medicine | Admitting: Emergency Medicine

## 2018-10-18 DIAGNOSIS — M549 Dorsalgia, unspecified: Secondary | ICD-10-CM | POA: Diagnosis not present

## 2018-10-18 DIAGNOSIS — Y939 Activity, unspecified: Secondary | ICD-10-CM | POA: Insufficient documentation

## 2018-10-18 DIAGNOSIS — Y999 Unspecified external cause status: Secondary | ICD-10-CM | POA: Diagnosis not present

## 2018-10-18 DIAGNOSIS — S299XXA Unspecified injury of thorax, initial encounter: Secondary | ICD-10-CM | POA: Diagnosis not present

## 2018-10-18 DIAGNOSIS — S39012A Strain of muscle, fascia and tendon of lower back, initial encounter: Secondary | ICD-10-CM | POA: Diagnosis not present

## 2018-10-18 DIAGNOSIS — M546 Pain in thoracic spine: Secondary | ICD-10-CM | POA: Diagnosis not present

## 2018-10-18 DIAGNOSIS — Z79899 Other long term (current) drug therapy: Secondary | ICD-10-CM | POA: Insufficient documentation

## 2018-10-18 DIAGNOSIS — M545 Low back pain: Secondary | ICD-10-CM | POA: Diagnosis not present

## 2018-10-18 DIAGNOSIS — F17210 Nicotine dependence, cigarettes, uncomplicated: Secondary | ICD-10-CM | POA: Diagnosis not present

## 2018-10-18 DIAGNOSIS — S161XXA Strain of muscle, fascia and tendon at neck level, initial encounter: Secondary | ICD-10-CM

## 2018-10-18 DIAGNOSIS — S3992XA Unspecified injury of lower back, initial encounter: Secondary | ICD-10-CM | POA: Diagnosis not present

## 2018-10-18 DIAGNOSIS — Y92410 Unspecified street and highway as the place of occurrence of the external cause: Secondary | ICD-10-CM | POA: Insufficient documentation

## 2018-10-18 DIAGNOSIS — I10 Essential (primary) hypertension: Secondary | ICD-10-CM | POA: Insufficient documentation

## 2018-10-18 DIAGNOSIS — S199XXA Unspecified injury of neck, initial encounter: Secondary | ICD-10-CM | POA: Diagnosis not present

## 2018-10-18 DIAGNOSIS — M542 Cervicalgia: Secondary | ICD-10-CM | POA: Diagnosis not present

## 2018-10-18 MED ORDER — CYCLOBENZAPRINE HCL 5 MG PO TABS: 5.0000 mg | ORAL_TABLET | Freq: Three times a day (TID) | ORAL | 0 refills | Status: DC | PRN

## 2018-10-18 MED ORDER — TRAMADOL HCL 50 MG PO TABS: 50.0000 mg | ORAL_TABLET | Freq: Four times a day (QID) | ORAL | 0 refills | Status: DC | PRN
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
## 2018-10-18 NOTE — ED Notes (Signed)
Patient transported to X-ray 
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
## 2018-10-18 NOTE — ED Notes (Signed)
Pt changed into a gown.
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
## 2018-12-04 DIAGNOSIS — R519 Headache, unspecified: Secondary | ICD-10-CM | POA: Diagnosis not present

## 2018-12-04 DIAGNOSIS — J060 Acute laryngopharyngitis: Secondary | ICD-10-CM | POA: Diagnosis not present

## 2018-12-04 DIAGNOSIS — F419 Anxiety disorder, unspecified: Secondary | ICD-10-CM | POA: Diagnosis not present

## 2018-12-04 DIAGNOSIS — H60501 Unspecified acute noninfective otitis externa, right ear: Secondary | ICD-10-CM | POA: Diagnosis not present

## 2018-12-04 DIAGNOSIS — R600 Localized edema: Secondary | ICD-10-CM | POA: Diagnosis not present

## 2018-12-04 DIAGNOSIS — F331 Major depressive disorder, recurrent, moderate: Secondary | ICD-10-CM | POA: Diagnosis not present

## 2018-12-04 DIAGNOSIS — F5101 Primary insomnia: Secondary | ICD-10-CM | POA: Diagnosis not present

## 2018-12-04 DIAGNOSIS — Z720 Tobacco use: Secondary | ICD-10-CM | POA: Diagnosis not present

## 2018-12-04 DIAGNOSIS — I10 Essential (primary) hypertension: Secondary | ICD-10-CM | POA: Diagnosis not present

## 2018-12-04 DIAGNOSIS — N898 Other specified noninflammatory disorders of vagina: Secondary | ICD-10-CM | POA: Diagnosis not present

## 2018-12-04 DIAGNOSIS — F39 Unspecified mood [affective] disorder: Secondary | ICD-10-CM | POA: Diagnosis not present

## 2018-12-04 DIAGNOSIS — R7301 Impaired fasting glucose: Secondary | ICD-10-CM | POA: Diagnosis not present

## 2018-12-07 DIAGNOSIS — E876 Hypokalemia: Secondary | ICD-10-CM | POA: Diagnosis not present

## 2018-12-07 DIAGNOSIS — Z720 Tobacco use: Secondary | ICD-10-CM | POA: Diagnosis not present

## 2018-12-07 DIAGNOSIS — F331 Major depressive disorder, recurrent, moderate: Secondary | ICD-10-CM | POA: Diagnosis not present

## 2018-12-07 DIAGNOSIS — R7301 Impaired fasting glucose: Secondary | ICD-10-CM | POA: Diagnosis not present

## 2018-12-07 DIAGNOSIS — D72829 Elevated white blood cell count, unspecified: Secondary | ICD-10-CM | POA: Diagnosis not present

## 2018-12-07 DIAGNOSIS — Z23 Encounter for immunization: Secondary | ICD-10-CM | POA: Diagnosis not present

## 2018-12-07 DIAGNOSIS — K219 Gastro-esophageal reflux disease without esophagitis: Secondary | ICD-10-CM | POA: Diagnosis not present

## 2018-12-07 DIAGNOSIS — F419 Anxiety disorder, unspecified: Secondary | ICD-10-CM | POA: Diagnosis not present

## 2018-12-07 DIAGNOSIS — Z0000 Encounter for general adult medical examination without abnormal findings: Secondary | ICD-10-CM | POA: Diagnosis not present

## 2019-01-05 DIAGNOSIS — N898 Other specified noninflammatory disorders of vagina: Secondary | ICD-10-CM | POA: Diagnosis not present

## 2019-01-05 DIAGNOSIS — F39 Unspecified mood [affective] disorder: Secondary | ICD-10-CM | POA: Diagnosis not present

## 2019-01-05 DIAGNOSIS — I10 Essential (primary) hypertension: Secondary | ICD-10-CM | POA: Diagnosis not present

## 2019-01-05 DIAGNOSIS — R7301 Impaired fasting glucose: Secondary | ICD-10-CM | POA: Diagnosis not present

## 2019-01-05 DIAGNOSIS — R600 Localized edema: Secondary | ICD-10-CM | POA: Diagnosis not present

## 2019-01-05 DIAGNOSIS — F331 Major depressive disorder, recurrent, moderate: Secondary | ICD-10-CM | POA: Diagnosis not present

## 2019-01-05 DIAGNOSIS — J060 Acute laryngopharyngitis: Secondary | ICD-10-CM | POA: Diagnosis not present

## 2019-01-05 DIAGNOSIS — Z720 Tobacco use: Secondary | ICD-10-CM | POA: Diagnosis not present

## 2019-01-05 DIAGNOSIS — F419 Anxiety disorder, unspecified: Secondary | ICD-10-CM | POA: Diagnosis not present

## 2019-01-05 DIAGNOSIS — H60501 Unspecified acute noninfective otitis externa, right ear: Secondary | ICD-10-CM | POA: Diagnosis not present

## 2019-01-05 DIAGNOSIS — R519 Headache, unspecified: Secondary | ICD-10-CM | POA: Diagnosis not present

## 2019-01-05 DIAGNOSIS — F5101 Primary insomnia: Secondary | ICD-10-CM | POA: Diagnosis not present

## 2019-01-08 DIAGNOSIS — R300 Dysuria: Secondary | ICD-10-CM | POA: Diagnosis not present

## 2019-01-08 DIAGNOSIS — D72829 Elevated white blood cell count, unspecified: Secondary | ICD-10-CM | POA: Diagnosis not present

## 2019-01-08 DIAGNOSIS — F331 Major depressive disorder, recurrent, moderate: Secondary | ICD-10-CM | POA: Diagnosis not present

## 2019-01-08 DIAGNOSIS — F419 Anxiety disorder, unspecified: Secondary | ICD-10-CM | POA: Diagnosis not present

## 2019-01-08 DIAGNOSIS — R5383 Other fatigue: Secondary | ICD-10-CM | POA: Diagnosis not present

## 2019-01-08 DIAGNOSIS — K219 Gastro-esophageal reflux disease without esophagitis: Secondary | ICD-10-CM | POA: Diagnosis not present

## 2019-01-08 DIAGNOSIS — F17211 Nicotine dependence, cigarettes, in remission: Secondary | ICD-10-CM | POA: Diagnosis not present

## 2019-01-08 DIAGNOSIS — R7301 Impaired fasting glucose: Secondary | ICD-10-CM | POA: Diagnosis not present

## 2019-01-08 DIAGNOSIS — E876 Hypokalemia: Secondary | ICD-10-CM | POA: Diagnosis not present

## 2019-01-12 DIAGNOSIS — N390 Urinary tract infection, site not specified: Secondary | ICD-10-CM | POA: Diagnosis not present

## 2019-01-14 ENCOUNTER — Other Ambulatory Visit: Payer: Self-pay

## 2019-01-14 ENCOUNTER — Ambulatory Visit
Admission: EM | Admit: 2019-01-14 | Discharge: 2019-01-14 | Disposition: A | Discharge status: Home / Self Care | Payer: Medicare Other | Attending: Emergency Medicine | Admitting: Emergency Medicine

## 2019-01-14 DIAGNOSIS — H60501 Unspecified acute noninfective otitis externa, right ear: Secondary | ICD-10-CM | POA: Diagnosis not present

## 2019-01-14 MED ORDER — CIPROFLOXACIN-DEXAMETHASONE 0.3-0.1 % OT SUSP: 4.0000 [drp] | Freq: Two times a day (BID) | OTIC | 0 refills | Status: AC

## 2019-01-14 NOTE — ED Triage Notes (Signed)
Pt presents to UC w/ c/o right ear pain which radiates to right side of neck which started yesterday. Denies injury

## 2019-01-14 NOTE — ED Provider Notes (Signed)
[facility]   [id number] 01/14/19 Arrival Time: 1117  CC:EAR PAIN  SUBJECTIVE: History from: patient.  Christina Fernandez is a 48 y.o. female who presents with of right ear pain and feeling clogged x 1 day.  Denies a precipitating event, such as swimming or wearing ear plugs.  Patient states the pain is constant and sharp in character.  Has not tried OTC medications.  Denies specific aggravating factors.  Improved with pulling ear lobe down.  Reports similar symptoms in the past that improved with ear drops.  Complains of associated decreased hearing.  Denies fever, chills, fatigue, sinus pain, rhinorrhea, ear discharge, sore throat, SOB, wheezing, chest pain, nausea, changes in bowel or bladder habits.    ROS: As per HPI.  All other pertinent ROS negative.     Past Medical History:  Diagnosis Date  . Acid reflux   . Hypertension   . Insomnia   . Muscle spasms of lower extremity   . Panic attacks    Past Surgical History:  Procedure Laterality Date  . ABDOMINAL HYSTERECTOMY    . PARTIAL HYSTERECTOMY     Allergies  Allergen Reactions  . Aspirin Hives   No current facility-administered medications on file prior to encounter.   Current Outpatient Medications on File Prior to Encounter  Medication Sig Dispense Refill  . aspirin-acetaminophen-caffeine (HEADACHE FORMULA) 250-250-65 MG tablet Take 1 tablet by mouth every 6 (six) hours as needed for headache.    . cyclobenzaprine (FLEXERIL) 5 MG tablet Take 1 tablet (5 mg total) by mouth 3 (three) times daily as needed for muscle spasms. 15 tablet 0  . escitalopram (LEXAPRO) 10 MG tablet Take 1 tablet (10 mg total) by mouth daily. 30 tablet 2  . hydrochlorothiazide (HYDRODIURIL) 12.5 MG tablet Take 12.5 mg by mouth daily.     Marland Kitchen omeprazole (PRILOSEC) 20 MG capsule Take 20 mg by mouth daily.     . traMADol (ULTRAM) 50 MG tablet Take 1 tablet (50 mg total) by mouth every 6 (six) hours as needed. 15 tablet 0  . traZODone  (DESYREL) 50 MG tablet Take 1 tablet (50 mg total) by mouth at bedtime. 30 tablet 2   Social History   Socioeconomic History  . Marital status: Single    Spouse name: Not on file  . Number of children: Not on file  . Years of education: Not on file  . Highest education level: Not on file  Occupational History  . Not on file  Tobacco Use  . Smoking status: Heavy Tobacco Smoker    Packs/day: 0.50    Types: Cigarettes  . Smokeless tobacco: Never Used  Substance and Sexual Activity  . Alcohol use: Yes    Comment: occ  . Drug use: No  . Sexual activity: Not Currently    Birth control/protection: Surgical  Other Topics Concern  . Not on file  Social History Narrative  . Not on file   Social Determinants of Health   Financial Resource Strain:   . Difficulty of Paying Living Expenses: Not on file  Food Insecurity:   . Worried About Programme researcher, broadcasting/film/video in the Last Year: Not on file  . Ran Out of Food in the Last Year: Not on file  Transportation Needs:   . Lack of Transportation (Medical): Not on file  . Lack of Transportation (Non-Medical): Not on file  Physical Activity:   . Days of Exercise per Week: Not on file  . Minutes of Exercise per Session:  Not on file  Stress:   . Feeling of Stress : Not on file  Social Connections:   . Frequency of Communication with Friends and Family: Not on file  . Frequency of Social Gatherings with Friends and Family: Not on file  . Attends Religious Services: Not on file  . Active Member of Clubs or Organizations: Not on file  . Attends Archivist Meetings: Not on file  . Marital Status: Not on file  Intimate Partner Violence:   . Fear of Current or Ex-Partner: Not on file  . Emotionally Abused: Not on file  . Physically Abused: Not on file  . Sexually Abused: Not on file   Family History  Problem Relation Age of Onset  . Healthy Mother   . Healthy Father     OBJECTIVE:  Vitals:   01/14/19 1215  BP: (!) 143/84    Pulse: 97  Resp: 16  Temp: 98.2 F (36.8 C)  TempSrc: Oral  SpO2: 95%    General appearance: alert; appears fatigued HEENT: Ears: LT EAC clear, RT EAC swollen and erythematous, TMs pearly gray with visible cone of light, without erythema; Eyes: PERRL, EOMI grossly; Nose: patent without rhinorrhea; Throat: oropharynx clear, tonsils not enlarged or erythematous without white tonsillar exudates, uvula midline Neck: supple without LAD Lungs: unlabored respirations, symmetrical air entry; cough: absent; no respiratory distress Heart: regular rate and rhythm.   Skin: warm and dry Psychological: alert and cooperative; normal mood and affect  ASSESSMENT & PLAN:  1. Acute otitis externa of right ear, unspecified type     Meds ordered this encounter  Medications  . ciprofloxacin-dexamethasone (CIPRODEX) OTIC suspension    Sig: Place 4 drops into the right ear 2 (two) times daily for 7 days.    Dispense:  7.5 mL    Refill:  0    Order Specific Question:   Supervising Provider    Answer:   Raylene Everts [[id number]]    Rest and drink plenty of fluids Ear wick placed Prescribed ciprofloxacin ear drops Take medications as directed and to completion Continue to use OTC ibuprofen and/ or tylenol as needed for pain control Follow up with PCP if symptoms persists Return here or go to the ER if you have any new or worsening symptoms fever, chills, nausea, vomiting, redness, swelling, discharge, worsening symptoms despite medication, etc...  Reviewed expectations re: course of current medical issues. Questions answered. Outlined signs and symptoms indicating need for more acute intervention. Patient verbalized understanding. After Visit Summary given.         Lestine Box, PA-C 01/14/19 1328

## 2019-01-14 NOTE — Discharge Instructions (Signed)
Rest and drink plenty of fluids Ear wick placed Prescribed ciprofloxacin ear drops Take medications as directed and to completion Continue to use OTC ibuprofen and/ or tylenol as needed for pain control Follow up with PCP if symptoms persists Return here or go to the ER if you have any new or worsening symptoms fever, chills, nausea, vomiting, redness, swelling, discharge, worsening symptoms despite medication, etc..Marland Kitchen

## 2019-02-16 DIAGNOSIS — D72829 Elevated white blood cell count, unspecified: Secondary | ICD-10-CM | POA: Diagnosis not present

## 2019-03-07 ENCOUNTER — Encounter (HOSPITAL_COMMUNITY): Payer: Self-pay

## 2019-03-07 ENCOUNTER — Other Ambulatory Visit: Payer: Self-pay

## 2019-03-08 ENCOUNTER — Inpatient Hospital Stay (HOSPITAL_COMMUNITY): Payer: Medicare Other | Attending: Hematology | Admitting: Hematology

## 2019-03-08 ENCOUNTER — Encounter (HOSPITAL_COMMUNITY): Payer: Self-pay | Admitting: Hematology

## 2019-03-08 ENCOUNTER — Inpatient Hospital Stay (HOSPITAL_COMMUNITY): Payer: Medicare Other

## 2019-03-08 VITALS — BP 148/87 | HR 101 | Temp 97.3°F | Resp 18 | Ht 63.0 in | Wt 112.1 lb

## 2019-03-08 DIAGNOSIS — Z87891 Personal history of nicotine dependence: Secondary | ICD-10-CM

## 2019-03-08 DIAGNOSIS — E538 Deficiency of other specified B group vitamins: Secondary | ICD-10-CM | POA: Diagnosis not present

## 2019-03-08 DIAGNOSIS — G4700 Insomnia, unspecified: Secondary | ICD-10-CM | POA: Insufficient documentation

## 2019-03-08 DIAGNOSIS — K219 Gastro-esophageal reflux disease without esophagitis: Secondary | ICD-10-CM | POA: Diagnosis not present

## 2019-03-08 DIAGNOSIS — E559 Vitamin D deficiency, unspecified: Secondary | ICD-10-CM | POA: Diagnosis not present

## 2019-03-08 DIAGNOSIS — I10 Essential (primary) hypertension: Secondary | ICD-10-CM | POA: Insufficient documentation

## 2019-03-08 DIAGNOSIS — Z833 Family history of diabetes mellitus: Secondary | ICD-10-CM | POA: Insufficient documentation

## 2019-03-08 DIAGNOSIS — Z8249 Family history of ischemic heart disease and other diseases of the circulatory system: Secondary | ICD-10-CM | POA: Insufficient documentation

## 2019-03-08 DIAGNOSIS — R519 Headache, unspecified: Secondary | ICD-10-CM | POA: Diagnosis not present

## 2019-03-08 DIAGNOSIS — D72829 Elevated white blood cell count, unspecified: Secondary | ICD-10-CM

## 2019-03-08 DIAGNOSIS — D72825 Bandemia: Secondary | ICD-10-CM

## 2019-03-08 DIAGNOSIS — Z79899 Other long term (current) drug therapy: Secondary | ICD-10-CM

## 2019-03-08 LAB — CBC WITH DIFFERENTIAL/PLATELET
Basophils Absolute: 0.1 10*3/uL (ref 0.0–0.1)
Basophils Relative: 1 %
Eosinophils Absolute: 0.1 10*3/uL (ref 0.0–0.5)
Eosinophils Relative: 1 %
HCT: 44.5 % (ref 36.0–46.0)
Hemoglobin: 14.4 g/dL (ref 12.0–15.0)
Lymphocytes Relative: 19 %
Lymphs Abs: 1.7 10*3/uL (ref 0.7–4.0)
MCH: 29.1 pg (ref 26.0–34.0)
MCHC: 32.4 g/dL (ref 30.0–36.0)
MCV: 89.9 fL (ref 80.0–100.0)
Monocytes Absolute: 0.7 10*3/uL (ref 0.1–1.0)
Monocytes Relative: 8 %
Neutro Abs: 6.4 10*3/uL (ref 1.7–7.7)
Neutrophils Relative %: 71 %
Platelets: 303 10*3/uL (ref 150–400)
RBC: 4.95 MIL/uL (ref 3.87–5.11)
RDW: 13.7 % (ref 11.5–15.5)
WBC Morphology: REACTIVE
WBC: 9.1 10*3/uL (ref 4.0–10.5)
nRBC: 0 % (ref 0.0–0.2)

## 2019-03-08 LAB — COMPREHENSIVE METABOLIC PANEL
ALT: 25 U/L (ref 0–44)
AST: 18 U/L (ref 15–41)
Albumin: 4.2 g/dL (ref 3.5–5.0)
Alkaline Phosphatase: 71 U/L (ref 38–126)
Anion gap: 12 (ref 5–15)
BUN: 7 mg/dL (ref 6–20)
CO2: 31 mmol/L (ref 22–32)
Calcium: 9.6 mg/dL (ref 8.9–10.3)
Chloride: 97 mmol/L — ABNORMAL LOW (ref 98–111)
Creatinine, Ser: 0.41 mg/dL — ABNORMAL LOW (ref 0.44–1.00)
GFR calc Af Amer: >60 mL/min (ref >60)
GFR calc non Af Amer: >60 mL/min (ref >60)
Glucose, Bld: 97 mg/dL (ref 70–99)
Potassium: 3.2 mmol/L — ABNORMAL LOW (ref 3.5–5.1)
Sodium: 140 mmol/L (ref 135–145)
Total Bilirubin: 0.7 mg/dL (ref 0.3–1.2)
Total Protein: 7.5 g/dL (ref 6.5–8.1)

## 2019-03-08 LAB — SAVE SMEAR(SSMR), FOR PROVIDER SLIDE REVIEW

## 2019-03-08 LAB — VITAMIN B12: Vitamin B-12: 167 pg/mL — ABNORMAL LOW (ref 180–914)

## 2019-03-08 LAB — VITAMIN D 25 HYDROXY (VIT D DEFICIENCY, FRACTURES): Vit D, 25-Hydroxy: 12.84 ng/mL — ABNORMAL LOW (ref 30–100)

## 2019-03-08 LAB — LACTATE DEHYDROGENASE: LDH: 97 U/L — ABNORMAL LOW (ref 98–192)

## 2019-03-08 NOTE — Patient Instructions (Signed)
[facility] Discharge Instructions  Follow up in 2-3 weeks   Thank you for choosing  Cancer Center at [facility] to provide your oncology and hematology care.  To afford each patient quality time with our provider, please arrive at least 15 minutes before your scheduled appointment time.   If you have a lab appointment with the Cancer Center please come in thru the Main Entrance and check in at the main information desk.  You need to re-schedule your appointment should you arrive 10 or more minutes late.  We strive to give you quality time with our providers, and arriving late affects you and other patients whose appointments are after yours.  Also, if you no show three or more times for appointments you may be dismissed from the clinic at the providers discretion.     Again, thank you for choosing [facility].  Our hope is that these requests will decrease the amount of time that you wait before being seen by our physicians.       _____________________________________________________________  Should you have questions after your visit to [facility], please contact our office at [phone] between the hours of 8:00 a.m. and 4:30 p.m.  Voicemails left after 4:00 p.m. will not be returned until the following business day.  For prescription refill requests, have your pharmacy contact our office and allow 72 hours.    Due to Covid, you will need to wear a mask upon entering the hospital. If you do not have a mask, a mask will be given to you at the Main Entrance upon arrival. For doctor visits, patients may have 1 support person with them. For treatment visits, patients can not have anyone with them due to social distancing guidelines and our immunocompromised population.      

## 2019-03-08 NOTE — Assessment & Plan Note (Addendum)
1.  Neutrophilic leukocytosis: -CBC on 01/29/2019 shows white count 12.3 with a normal hemoglobin and hematocrit.  Differential showed increased absolute neutrophil count. -She had elevated white count ranging from 13 K-28K since 2013. -CT abdomen and pelvis from 2016 shows normal spleen but hepatic steatosis. -Denies any recurrent infections.  She is a current active smoker. -Denies any fevers, night sweats or weight loss in the last 6 months. -No personal history of any connective tissue disorders.  No systemic or topical steroid use. -We will repeat her CBC and check her for myeloproliferative disorders including CML and polycythemia.  We will also check her for ANA, rheumatoid factor and review her smear. -She will come back in 2 to 3 weeks to discuss results and further plan.

## 2019-03-08 NOTE — Progress Notes (Signed)
CONSULT NOTE  Patient Care Team: Celene Squibb, MD as PCP - General (Internal Medicine)  CHIEF COMPLAINTS/PURPOSE OF CONSULTATION: Leukocytosis  HISTORY OF PRESENTING ILLNESS:  Christina Fernandez 49 y.o. female was sent here by her PCP for leukocytosis.  She has had leukocytosis dating back to 2013.  Her WBC was as high as 28.8 in 2013.  Patient denies any chronic infection or need for antibiotics in the past years.  Patient denies any steroid use including pills, injections, or creams.  Patient reports she smoked from age 29.  She smoked about a pack per day.  She stopped June 2020.  Patient reports she does drink alcohol occasionally.  She denies any B symptoms.  Patient denies any recent chest pain on exertion, shortness of breath on minimal exertion, syncopal episodes, or palpitations.  She has not noticed any recent bleeding such as epistasis, hematuria or hematochezia.  She has had no prior history or diagnosis of cancer.  She denies any donation of blood or received any blood transfusions. She denies a family history of any cancer.  She has not been exposed to any harmful chemicals.  She has been on disability since she was a child.    MEDICAL HISTORY:  Past Medical History:  Diagnosis Date  . Acid reflux   . Hypertension   . Insomnia   . Muscle spasms of lower extremity   . Panic attacks     SURGICAL HISTORY: Past Surgical History:  Procedure Laterality Date  . ABDOMINAL HYSTERECTOMY    . PARTIAL HYSTERECTOMY      SOCIAL HISTORY: Social History   Socioeconomic History  . Marital status: Single    Spouse name: Not on file  . Number of children: 1  . Years of education: Not on file  . Highest education level: Not on file  Occupational History  . Not on file  Tobacco Use  . Smoking status: Former Smoker    Packs/day: 0.50    Types: Cigarettes    Quit date: 03/06/2017    Years since quitting: 2.0  . Smokeless tobacco: Never Used  Substance and Sexual Activity  .  Alcohol use: Not Currently    Comment: occ  . Drug use: No  . Sexual activity: Yes    Birth control/protection: Surgical  Other Topics Concern  . Not on file  Social History Narrative  . Not on file   Social Determinants of Health   Financial Resource Strain:   . Difficulty of Paying Living Expenses: Not on file  Food Insecurity:   . Worried About Charity fundraiser in the Last Year: Not on file  . Ran Out of Food in the Last Year: Not on file  Transportation Needs:   . Lack of Transportation (Medical): Not on file  . Lack of Transportation (Non-Medical): Not on file  Physical Activity:   . Days of Exercise per Week: Not on file  . Minutes of Exercise per Session: Not on file  Stress:   . Feeling of Stress : Not on file  Social Connections:   . Frequency of Communication with Friends and Family: Not on file  . Frequency of Social Gatherings with Friends and Family: Not on file  . Attends Religious Services: Not on file  . Active Member of Clubs or Organizations: Not on file  . Attends Archivist Meetings: Not on file  . Marital Status: Not on file  Intimate Partner Violence:   . Fear of Current or  Ex-Partner: Not on file  . Emotionally Abused: Not on file  . Physically Abused: Not on file  . Sexually Abused: Not on file    FAMILY HISTORY: Family History  Problem Relation Age of Onset  . Atrial fibrillation Mother   . Hypertension Mother   . Diabetes Mother   . Aortic aneurysm Father   . Down syndrome Sister   . Healthy Brother   . Hypertension Maternal Grandfather   . Stroke Maternal Grandfather   . Healthy Brother   . Healthy Brother   . Autism Son     ALLERGIES:  is allergic to aspirin.  MEDICATIONS:  Current Outpatient Medications  Medication Sig Dispense Refill  . cyclobenzaprine (FLEXERIL) 5 MG tablet Take 1 tablet (5 mg total) by mouth 3 (three) times daily as needed for muscle spasms. (Patient not taking: Reported on 03/07/2019) 15 tablet 0   . escitalopram (LEXAPRO) 10 MG tablet Take 1 tablet (10 mg total) by mouth daily. 30 tablet 2  . hydrochlorothiazide (HYDRODIURIL) 12.5 MG tablet Take 12.5 mg by mouth daily.     Marland Kitchen omeprazole (PRILOSEC) 20 MG capsule Take 20 mg by mouth daily.     . traZODone (DESYREL) 50 MG tablet Take 1 tablet (50 mg total) by mouth at bedtime. 30 tablet 2   No current facility-administered medications for this visit.    REVIEW OF SYSTEMS:   Constitutional: Denies fevers, chills or abnormal night sweats Respiratory: Denies cough, dyspnea or wheezes Cardiovascular: Denies palpitation, chest discomfort or lower extremity swelling Gastrointestinal:  Denies nausea, heartburn or change in bowel habits Skin: Denies abnormal skin rashes Lymphatics: Denies new lymphadenopathy or easy bruising Neurological:Denies numbness, tingling or new weaknesses Behavioral/Psych: Mood is stable, no new changes  All other systems were reviewed with the patient and are negative.  PHYSICAL EXAMINATION: ECOG PERFORMANCE STATUS: 0 - Asymptomatic  Vitals:   03/08/19 0900  BP: (!) 148/87  Pulse: (!) 101  Resp: 18  Temp: (!) 97.3 F (36.3 C)  SpO2: 97%   Filed Weights   03/08/19 0900  Weight: 112 lb 2 oz (50.9 kg)    GENERAL:alert, no distress and comfortable NECK: supple, thyroid normal size, non-tender, without nodularity LYMPH:  no palpable lymphadenopathy in the cervical, axillary or inguinal LUNGS: clear to auscultation and percussion with normal breathing effort HEART: regular rate & rhythm and no murmurs and no lower extremity edema ABDOMEN:abdomen soft, non-tender and normal bowel sounds Musculoskeletal:no cyanosis of digits and no clubbing  PSYCH: alert & oriented x 3 with fluent speech NEURO: no focal motor/sensory deficits  LABORATORY DATA:  I have reviewed the data as listed  RADIOGRAPHIC STUDIES: I have personally reviewed the radiological images as listed and agreed with the findings in the  report.  I have independently elicited history and examined this patient.  I agree with the HPI written by my nurse practitioner Corliss Skains, FNP.  I have independently formulated my assessment and plan.  ASSESSMENT & PLAN:  Leukocytosis 1.  Neutrophilic leukocytosis: -CBC on 01/29/2019 shows white count 12.3 with a normal hemoglobin and hematocrit.  Differential showed increased absolute neutrophil count. -She had elevated white count ranging from 13 K-28K since 2013. -CT abdomen and pelvis from 2016 shows normal spleen but hepatic steatosis. -Denies any recurrent infections.  She is a current active smoker. -Denies any fevers, night sweats or weight loss in the last 6 months. -No personal history of any connective tissue disorders.  No systemic or topical steroid use. -We will  repeat her CBC and check her for myeloproliferative disorders including CML and polycythemia.  We will also check her for ANA, rheumatoid factor and review her smear. -She will come back in 2 to 3 weeks to discuss results and further plan.   Total time spent is 40 minutes with more than 80% of the time spent face-to-face obtaining history, discussing differential diagnosis, further work-up, counseling and coordination of care.  All questions were answered. The patient knows to call the clinic with any problems, questions or concerns.     Doreatha Massed, MD 03/08/19 2:21 PM

## 2019-03-09 LAB — RHEUMATOID FACTOR: Rheumatoid fact SerPl-aCnc: <10 IU/mL (ref 0.0–13.9)

## 2019-03-09 LAB — ANTINUCLEAR ANTIBODIES, IFA: ANA Ab, IFA: NEGATIVE

## 2019-03-14 LAB — BCR-ABL1 FISH
Cells Analyzed: 200
Cells Counted: 200

## 2019-03-16 LAB — JAK2 V617F, W REFLEX TO CALR/E12/MPL

## 2019-03-16 LAB — CALR + JAK2 E12-15 + MPL (REFLEXED)

## 2019-03-29 ENCOUNTER — Other Ambulatory Visit: Payer: Self-pay

## 2019-03-29 ENCOUNTER — Inpatient Hospital Stay (HOSPITAL_BASED_OUTPATIENT_CLINIC_OR_DEPARTMENT_OTHER): Payer: Medicare Other | Admitting: Hematology

## 2019-03-29 ENCOUNTER — Encounter (HOSPITAL_COMMUNITY): Payer: Self-pay | Admitting: Hematology

## 2019-03-29 VITALS — BP 122/91 | HR 81 | Temp 97.5°F | Resp 24 | Wt 110.0 lb

## 2019-03-29 DIAGNOSIS — D72829 Elevated white blood cell count, unspecified: Secondary | ICD-10-CM

## 2019-03-29 DIAGNOSIS — G4700 Insomnia, unspecified: Secondary | ICD-10-CM | POA: Diagnosis not present

## 2019-03-29 DIAGNOSIS — E559 Vitamin D deficiency, unspecified: Secondary | ICD-10-CM | POA: Diagnosis not present

## 2019-03-29 DIAGNOSIS — I10 Essential (primary) hypertension: Secondary | ICD-10-CM | POA: Diagnosis not present

## 2019-03-29 DIAGNOSIS — E538 Deficiency of other specified B group vitamins: Secondary | ICD-10-CM | POA: Diagnosis not present

## 2019-03-29 DIAGNOSIS — R519 Headache, unspecified: Secondary | ICD-10-CM | POA: Diagnosis not present

## 2019-03-29 MED ORDER — CYANOCOBALAMIN 1000 MCG/ML IJ SOLN
1000.0000 ug | Freq: Once | INTRAMUSCULAR | Status: AC
  Administered 2019-03-29: 12:00:00 1000 ug via INTRAMUSCULAR
  Filled 2019-03-29: qty 1

## 2019-03-29 NOTE — Patient Instructions (Addendum)
[facility] at [facility] Discharge Instructions  You were seen today by Dr. Ellin Saba. He went over your recent test results. He will see you back in 3 months for labs and follow up.  Start taking B12 1mg  and Vitamin D 5,000units daily. You can get these over the counter at any drug store.  Thank you for choosing [facility] at [facility] to provide your oncology and hematology care.  To afford each patient quality time with our provider, please arrive at least 15 minutes before your scheduled appointment time.   If you have a lab appointment with the Cancer Center please come in thru the  Main Entrance and check in at the main information desk  You need to re-schedule your appointment should you arrive 10 or more minutes late.  We strive to give you quality time with our providers, and arriving late affects you and other patients whose appointments are after yours.  Also, if you no show three or more times for appointments you may be dismissed from the clinic at the providers discretion.     Again, thank you for choosing [facility].  Our hope is that these requests will decrease the amount of time that you wait before being seen by our physicians.       _____________________________________________________________  Should you have questions after your visit to [facility], please contact our office at [phone] between the hours of 8:00 a.m. and 4:30 p.m.  Voicemails left after 4:00 p.m. will not be returned until the following business day.  For prescription refill requests, have your pharmacy contact our office and allow 72 hours.    Cancer Center Support Programs:   > Cancer Support Group  2nd Tuesday of the month 1pm-2pm, Journey Room

## 2019-03-29 NOTE — Assessment & Plan Note (Signed)
1.  Jak 2 and BCR/ABL negative resolved leukocytosis: -She had prior elevated white count since 2013 ranging from 13 K-20 8K. -We repeated her labs on 03/08/2019.  Hemoglobin was 14.4.  White count was 9.1 with 71% neutrophils and 19% lymphocytes.  Platelet count was normal. -We discussed the results of JAK2 V617F and BCR/ABL testing which was negative. -Lupus and rheumatoid arthritis test was also negative. -She has quit smoking about a year ago. -I plan to repeat her blood counts in 3 to 4 months.  2.  Vitamin D deficiency: -Vitamin D was 12.84.  She was instructed to take vitamin D 5000 units daily.  We will repeat levels at next visit in 3 months.  3.  Vitamin B12 deficiency: -Vitamin B12 was found to be 167.  Hemoglobin is 14.4. -We have given vitamin B12 injection today.  I have told her to take vitamin B12 1 mg tablet daily.  We will repeat labs in 3 months.

## 2019-03-29 NOTE — Progress Notes (Signed)
[facility] [address], [place] [address]   CLINIC:  Medical Oncology/Hematology  PCP:  Celene Squibb, MD [place] [address] [phone]   REASON FOR VISIT:  Follow-up for leukocytosis.  CURRENT THERAPY: Observation.  BRIEF ONCOLOGIC HISTORY:  Oncology History   No history exists.     CANCER STAGING: Cancer Staging No matching staging information was found for the patient.   INTERVAL HISTORY:  Christina Fernandez 49 y.o. female seen for follow-up of work-up for leukocytosis.  She had a history of leukocytosis as 2013.  She reports that she quit smoking a year ago.  Denies any recurrent infections.  No systemic steroid use.  No prior history of splenectomy.  Appetite and energy levels are 50%.  Chronic headaches are stable.  Anxiety and sleep problems are also stable.    REVIEW OF SYSTEMS:  Review of Systems  Neurological: Positive for headaches.  Psychiatric/Behavioral: Positive for sleep disturbance. The patient is nervous/anxious.   All other systems reviewed and are negative.    PAST MEDICAL/SURGICAL HISTORY:  Past Medical History:  Diagnosis Date   Acid reflux    Hypertension    Insomnia    Muscle spasms of lower extremity    Panic attacks    Past Surgical History:  Procedure Laterality Date   ABDOMINAL HYSTERECTOMY     PARTIAL HYSTERECTOMY       SOCIAL HISTORY:  Social History   Socioeconomic History   Marital status: Single    Spouse name: Not on file   Number of children: 1   Years of education: Not on file   Highest education level: Not on file  Occupational History   Not on file  Tobacco Use   Smoking status: Former Smoker    Packs/day: 0.50    Types: Cigarettes    Quit date: 03/06/2017    Years since quitting: 2.0   Smokeless tobacco: Never Used  Substance and Sexual Activity   Alcohol use: Not Currently    Comment: occ   Drug use: No   Sexual activity: Yes    Birth  control/protection: Surgical  Other Topics Concern   Not on file  Social History Narrative   Not on file   Social Determinants of Health   Financial Resource Strain:    Difficulty of Paying Living Expenses: Not on file  Food Insecurity:    Worried About [place] in the Last Year: Not on file   [employer] of Food in the Last Year: Not on file  Transportation Needs:    Lack of Transportation (Medical): Not on file   Lack of Transportation (Non-Medical): Not on file  Physical Activity:    Days of Exercise per Week: Not on file   Minutes of Exercise per Session: Not on file  Stress:    Feeling of Stress : Not on file  Social Connections:    Frequency of Communication with Friends and Family: Not on file   Frequency of Social Gatherings with Friends and Family: Not on file   Attends Religious Services: Not on file   Active Member of Clubs or Organizations: Not on file   Attends Archivist Meetings: Not on file   Marital Status: Not on file  Intimate Partner Violence:    Fear of Current or Ex-Partner: Not on file   Emotionally Abused: Not on file   Physically Abused: Not on file   Sexually Abused: Not on file  FAMILY HISTORY:  Family History  Problem Relation Age of Onset   Atrial fibrillation Mother    Hypertension Mother    Diabetes Mother    Aortic aneurysm Father    Down syndrome Sister    Healthy Brother    Hypertension Maternal Grandfather    Stroke Maternal Grandfather    Healthy Brother    Healthy Brother    Autism Son     CURRENT MEDICATIONS:  Outpatient Encounter Medications as of 03/29/2019  Medication Sig   cyclobenzaprine (FLEXERIL) 5 MG tablet Take 1 tablet (5 mg total) by mouth 3 (three) times daily as needed for muscle spasms. (Patient not taking: Reported on 03/07/2019)   escitalopram (LEXAPRO) 10 MG tablet Take 1 tablet (10 mg total) by mouth daily.   hydrochlorothiazide (HYDRODIURIL) 12.5 MG tablet  Take 12.5 mg by mouth daily.    omeprazole (PRILOSEC) 20 MG capsule Take 20 mg by mouth 2 (two) times daily before a meal.    traZODone (DESYREL) 50 MG tablet Take 1 tablet (50 mg total) by mouth at bedtime.   [EXPIRED] cyanocobalamin ((VITAMIN B-12)) injection 1,000 mcg    No facility-administered encounter medications on file as of 03/29/2019.    ALLERGIES:  Allergies  Allergen Reactions   Aspirin Hives     PHYSICAL EXAM:  ECOG Performance status: 1  Vitals:   03/29/19 1057  BP: (!) 122/91  Pulse: 81  Resp: (!) 24  Temp: (!) 97.5 F (36.4 C)  SpO2: 99%   Filed Weights   03/29/19 1057  Weight: 110 lb (49.9 kg)    Physical Exam Vitals reviewed.  Constitutional:      Appearance: Normal appearance.  Cardiovascular:     Rate and Rhythm: Normal rate and regular rhythm.     Heart sounds: Normal heart sounds.  Pulmonary:     Effort: Pulmonary effort is normal.     Breath sounds: Normal breath sounds.  Abdominal:     General: There is no distension.     Palpations: Abdomen is soft. There is no mass.  Skin:    General: Skin is warm.  Neurological:     General: No focal deficit present.     Mental Status: She is alert and oriented to person, place, and time.  Psychiatric:        Mood and Affect: Mood normal.        Behavior: Behavior normal.      LABORATORY DATA:  I have reviewed the labs as listed.  CBC    Component Value Date/Time   WBC 9.1 03/08/2019 1102   RBC 4.95 03/08/2019 1102   HGB 14.4 03/08/2019 1102   HGB 10.0 (L) 09/08/2011 0537   HCT 44.5 03/08/2019 1102   HCT 29.3 (L) 09/08/2011 0537   PLT 303 03/08/2019 1102   PLT 160 09/08/2011 0537   MCV 89.9 03/08/2019 1102   MCV 89 09/08/2011 0537   MCH 29.1 03/08/2019 1102   MCHC 32.4 03/08/2019 1102   RDW 13.7 03/08/2019 1102   RDW 14.2 09/08/2011 0537   LYMPHSABS 1.7 03/08/2019 1102   LYMPHSABS 1.8 09/08/2011 0537   MONOABS 0.7 03/08/2019 1102   MONOABS 1.1 (H) 09/08/2011 0537   EOSABS  0.1 03/08/2019 1102   EOSABS 0.6 09/08/2011 0537   BASOSABS 0.1 03/08/2019 1102   BASOSABS 0.1 09/08/2011 0537   CMP Latest Ref Rng & Units 03/08/2019 11/25/2014 03/24/2014  Glucose 70 - 99 mg/dL 97 256(L) 893(T)  BUN 6 - 20 mg/dL 7 11  21  Creatinine 0.44 - 1.00 mg/dL 3.66(Q) 9.47 6.54  Sodium 135 - 145 mmol/L 140 136 135  Potassium 3.5 - 5.1 mmol/L 3.2(L) 3.6 3.8  Chloride 98 - 111 mmol/L 97(L) 104 106  CO2 22 - 32 mmol/L 31 25 26   Calcium 8.9 - 10.3 mg/dL 9.6 ) 9.3  Total Protein 6.5 - 8.1 g/dL 7.5 7.1 8.3  Total Bilirubin 0.3 - 1.2 mg/dL 0.7 0.4 0.9  Alkaline Phos 38 - 126 U/L 71 80 82  AST 15 - 41 U/L 18 117(H) 22  ALT 0 - 44 U/L 25 114(H) 34       DIAGNOSTIC IMAGING:  I have independently reviewed the scans and discussed with the patient.    ASSESSMENT & PLAN:   Leukocytosis 1.  Jak 2 and BCR/ABL negative resolved leukocytosis: -She had prior elevated white count since 2013 ranging from 13 K-20 8K. -We repeated her labs on 03/08/2019.  Hemoglobin was 14.4.  White count was 9.1 with 71% neutrophils and 19% lymphocytes.  Platelet count was normal. -We discussed the results of JAK2 V617F and BCR/ABL testing which was negative. -Lupus and rheumatoid arthritis test was also negative. -She has quit smoking about a year ago. -I plan to repeat her blood counts in 3 to 4 months.  2.  Vitamin D deficiency: -Vitamin D was 12.84.  She was instructed to take vitamin D 5000 units daily.  We will repeat levels at next visit in 3 months.  3.  Vitamin B12 deficiency: -Vitamin B12 was found to be 167.  Hemoglobin is 14.4. -We have given vitamin B12 injection today.  I have told her to take vitamin B12 1 mg tablet daily.  We will repeat labs in 3 months.      Orders placed this encounter:  Orders Placed This Encounter  Procedures   CBC with Differential   Vitamin B12   Vitamin D 25 hydroxy      05/06/2019, MD [facility] [phone]

## 2019-05-24 ENCOUNTER — Encounter (HOSPITAL_COMMUNITY): Payer: Self-pay | Admitting: *Deleted

## 2019-05-24 NOTE — Progress Notes (Signed)
Patient called clinic reporting tiredness and no energy. She reports that she has been taking her vitamin B-12 and her vitamin D as prescribed.  She wants to know if she should come in sooner than her scheduled appt in 5 weeks.    I talked with Mathis Bud, NP and she wants patient to just wait until her scheduled appt.  I advised patient and advised her to continue to eat and drink well and conserve her energy when possible but also stay active.  She verbalizes understanding.

## 2019-05-29 DIAGNOSIS — H2513 Age-related nuclear cataract, bilateral: Secondary | ICD-10-CM | POA: Diagnosis not present

## 2019-05-29 DIAGNOSIS — H5213 Myopia, bilateral: Secondary | ICD-10-CM | POA: Diagnosis not present

## 2019-06-15 ENCOUNTER — Other Ambulatory Visit: Payer: Self-pay

## 2019-06-15 ENCOUNTER — Ambulatory Visit
Admission: EM | Admit: 2019-06-15 | Discharge: 2019-06-15 | Disposition: A | Discharge status: Home / Self Care | Payer: Medicare Other | Attending: Emergency Medicine | Admitting: Emergency Medicine

## 2019-06-15 DIAGNOSIS — H60391 Other infective otitis externa, right ear: Secondary | ICD-10-CM | POA: Diagnosis not present

## 2019-06-15 DIAGNOSIS — H9201 Otalgia, right ear: Secondary | ICD-10-CM

## 2019-06-15 MED ORDER — CIPROFLOXACIN-DEXAMETHASONE 0.3-0.1 % OT SUSP: 4.0000 [drp] | Freq: Two times a day (BID) | OTIC | 0 refills | Status: AC

## 2019-06-15 NOTE — Discharge Instructions (Addendum)
Rest and drink plenty of fluids ciprodex ear drops prescribed.  Take as directed and to completion Use OTC ibuprofen and/ or tylenol as needed for pain control Follow up with PCP if symptoms persists Return here or go to the ER if you have any new or worsening symptoms fever, chills, nausea, vomiting, ear pain, discharge, worsening symptoms despite treatment, etc..Marland Kitchen

## 2019-06-15 NOTE — ED Provider Notes (Signed)
[facility]   [id number] 06/15/19 Arrival Time: 1024  CC: EAR PAIN  SUBJECTIVE: History from: patient.  SHERIDEN ARCHIBEQUE is a 49 y.o. female who presents with of RT ear x 3 days.  Denies a precipitating event, such as swimming or wearing ear plugs.  Patient states the pain is constant and achy in character.  Denies alleviating symptoms.  Symptoms are made worse with ear manipulation.  Reports similar symptoms in the past that improved with ear drops.    Denies fever, chills, fatigue, sinus pain, rhinorrhea, ear discharge, sore throat, SOB, wheezing, chest pain, nausea, changes in bowel or bladder habits.    ROS: As per HPI.  All other pertinent ROS negative.     Past Medical History:  Diagnosis Date  . Acid reflux   . Hypertension   . Insomnia   . Muscle spasms of lower extremity   . Panic attacks    Past Surgical History:  Procedure Laterality Date  . ABDOMINAL HYSTERECTOMY    . PARTIAL HYSTERECTOMY     Allergies  Allergen Reactions  . Aspirin Hives   No current facility-administered medications on file prior to encounter.   Current Outpatient Medications on File Prior to Encounter  Medication Sig Dispense Refill  . escitalopram (LEXAPRO) 10 MG tablet Take 1 tablet (10 mg total) by mouth daily. 30 tablet 2  . hydrochlorothiazide (HYDRODIURIL) 12.5 MG tablet Take 12.5 mg by mouth daily.     Marland Kitchen omeprazole (PRILOSEC) 20 MG capsule Take 20 mg by mouth 2 (two) times daily before a meal.     . traZODone (DESYREL) 50 MG tablet Take 1 tablet (50 mg total) by mouth at bedtime. 30 tablet 2   Social History   Socioeconomic History  . Marital status: Single    Spouse name: Not on file  . Number of children: 1  . Years of education: Not on file  . Highest education level: Not on file  Occupational History  . Not on file  Tobacco Use  . Smoking status: Former Smoker    Packs/day: 0.50    Types: Cigarettes    Quit date: 03/06/2017    Years since quitting: 2.2  .  Smokeless tobacco: Never Used  Substance and Sexual Activity  . Alcohol use: Not Currently    Comment: occ  . Drug use: No  . Sexual activity: Yes    Birth control/protection: Surgical  Other Topics Concern  . Not on file  Social History Narrative  . Not on file   Social Determinants of Health   Financial Resource Strain:   . Difficulty of Paying Living Expenses:   Food Insecurity:   . Worried About Charity fundraiser in the Last Year:   . Arboriculturist in the Last Year:   Transportation Needs:   . Film/video editor (Medical):   Marland Kitchen Lack of Transportation (Non-Medical):   Physical Activity:   . Days of Exercise per Week:   . Minutes of Exercise per Session:   Stress:   . Feeling of Stress :   Social Connections:   . Frequency of Communication with Friends and Family:   . Frequency of Social Gatherings with Friends and Family:   . Attends Religious Services:   . Active Member of Clubs or Organizations:   . Attends Archivist Meetings:   Marland Kitchen Marital Status:   Intimate Partner Violence:   . Fear of Current or Ex-Partner:   . Emotionally Abused:   .  Physically Abused:   . Sexually Abused:    Family History  Problem Relation Age of Onset  . Atrial fibrillation Mother   . Hypertension Mother   . Diabetes Mother   . Aortic aneurysm Father   . Down syndrome Sister   . Healthy Brother   . Hypertension Maternal Grandfather   . Stroke Maternal Grandfather   . Healthy Brother   . Healthy Brother   . Autism Son     OBJECTIVE:  Vitals:   06/15/19 1033  BP: 137/88  Pulse: 95  Resp: 16  Temp: (!) 97.4 F (36.3 C)  SpO2: 96%    General appearance: alert; well-appearing, nontoxic; speaking in full sentences and tolerating own secretions HEENT: NCAT; Ears: LT EAC clear, RT EAC with erythema and swelling, TMs pearly gray; Eyes: PERRL.  EOM grossly intact. Nose: nares patent without rhinorrhea, Throat: oropharynx clear, tonsils non erythematous or enlarged,  uvula midline  Neck: supple without LAD Lungs: unlabored respirations, symmetrical air entry; cough: absent; no respiratory distress; CTAB Heart: regular rate and rhythm.  Skin: warm and dry Psychological: alert and cooperative; normal mood and affect   ASSESSMENT & PLAN:  1. Other infective acute otitis externa of right ear   2. Ear pain, right    Meds ordered this encounter  Medications  . ciprofloxacin-dexamethasone (CIPRODEX) OTIC suspension    Sig: Place 4 drops into the right ear 2 (two) times daily for 7 days.    Dispense:  7.5 mL    Refill:  0    Order Specific Question:   Supervising Provider    Answer:   Eustace Moore [[id number]]   Rest and drink plenty of fluids Ciprodex ear drops prescribed.  Take as directed and to completion Use OTC ibuprofen and/ or tylenol as needed for pain control Follow up with PCP if symptoms persists Return here or go to the ER if you have any new or worsening symptoms fever, chills, nausea, vomiting, ear pain, discharge, worsening symptoms despite treatment, etc...  Reviewed expectations re: course of current medical issues. Questions answered. Outlined signs and symptoms indicating need for more acute intervention. Patient verbalized understanding. After Visit Summary given.         Rennis Harding, PA-C 06/15/19 1132

## 2019-06-15 NOTE — ED Triage Notes (Signed)
Pt c/o pain and swelling to right ear x 3 days. Pt has taken ibuprofen at home with minimal relief

## 2019-06-26 DIAGNOSIS — H524 Presbyopia: Secondary | ICD-10-CM | POA: Diagnosis not present

## 2019-07-03 ENCOUNTER — Other Ambulatory Visit: Payer: Self-pay

## 2019-07-03 ENCOUNTER — Inpatient Hospital Stay (HOSPITAL_COMMUNITY): Payer: Medicare Other | Attending: Hematology

## 2019-07-03 DIAGNOSIS — E559 Vitamin D deficiency, unspecified: Secondary | ICD-10-CM | POA: Diagnosis not present

## 2019-07-03 DIAGNOSIS — F17210 Nicotine dependence, cigarettes, uncomplicated: Secondary | ICD-10-CM | POA: Insufficient documentation

## 2019-07-03 DIAGNOSIS — D72829 Elevated white blood cell count, unspecified: Secondary | ICD-10-CM | POA: Diagnosis not present

## 2019-07-03 DIAGNOSIS — E538 Deficiency of other specified B group vitamins: Secondary | ICD-10-CM | POA: Diagnosis not present

## 2019-07-03 LAB — CBC WITH DIFFERENTIAL/PLATELET
Abs Immature Granulocytes: 0.04 10*3/uL (ref 0.00–0.07)
Basophils Absolute: 0.1 10*3/uL (ref 0.0–0.1)
Basophils Relative: 1 %
Eosinophils Absolute: 0.2 10*3/uL (ref 0.0–0.5)
Eosinophils Relative: 2 %
HCT: 42.6 % (ref 36.0–46.0)
Hemoglobin: 13.8 g/dL (ref 12.0–15.0)
Immature Granulocytes: 0 %
Lymphocytes Relative: 17 %
Lymphs Abs: 2 10*3/uL (ref 0.7–4.0)
MCH: 29.3 pg (ref 26.0–34.0)
MCHC: 32.4 g/dL (ref 30.0–36.0)
MCV: 90.4 fL (ref 80.0–100.0)
Monocytes Absolute: 0.9 10*3/uL (ref 0.1–1.0)
Monocytes Relative: 8 %
Neutro Abs: 8.5 10*3/uL — ABNORMAL HIGH (ref 1.7–7.7)
Neutrophils Relative %: 72 %
Platelets: 311 10*3/uL (ref 150–400)
RBC: 4.71 MIL/uL (ref 3.87–5.11)
RDW: 13.2 % (ref 11.5–15.5)
WBC: 11.7 10*3/uL — ABNORMAL HIGH (ref 4.0–10.5)
nRBC: 0 % (ref 0.0–0.2)

## 2019-07-03 LAB — VITAMIN D 25 HYDROXY (VIT D DEFICIENCY, FRACTURES): Vit D, 25-Hydroxy: 69.49 ng/mL (ref 30–100)

## 2019-07-03 LAB — VITAMIN B12: Vitamin B-12: 511 pg/mL (ref 180–914)

## 2019-07-08 ENCOUNTER — Encounter: Payer: Self-pay | Admitting: Emergency Medicine

## 2019-07-08 ENCOUNTER — Ambulatory Visit
Admission: EM | Admit: 2019-07-08 | Discharge: 2019-07-08 | Disposition: A | Discharge status: Home / Self Care | Payer: Medicare Other | Attending: Emergency Medicine | Admitting: Emergency Medicine

## 2019-07-08 DIAGNOSIS — H60392 Other infective otitis externa, left ear: Secondary | ICD-10-CM | POA: Diagnosis not present

## 2019-07-08 DIAGNOSIS — H6591 Unspecified nonsuppurative otitis media, right ear: Secondary | ICD-10-CM | POA: Diagnosis not present

## 2019-07-08 MED ORDER — CIPROFLOXACIN-DEXAMETHASONE 0.3-0.1 % OT SUSP: 4.0000 [drp] | Freq: Two times a day (BID) | OTIC | 0 refills | Status: DC

## 2019-07-08 MED ORDER — FLUTICASONE PROPIONATE 50 MCG/ACT NA SUSP: 1.0000 | Freq: Every day | NASAL | 0 refills | Status: DC

## 2019-07-08 NOTE — ED Provider Notes (Signed)
[place]   [id number] 07/08/19 Arrival Time: 21  CC:EAR PAIN  SUBJECTIVE: History from: patient.  Christina Fernandez is a 49 y.o. female who presents with presents to the urgent care for complaint of left ear pain for the past 2 to 3 days.  Denies a precipitating event, such as swimming or wearing ear plugs.  Patient states the pain is constant and achy in character.  Patient has tried OTC medication without relief.  Symptoms are made worse with lying down.  Reports similar symptoms in the right ear few weeks ago and was treated with antibiotic eardrop.  Denies fever, chills, fatigue, sinus pain, rhinorrhea, ear discharge, sore throat, SOB, wheezing, chest pain, nausea, changes in bowel or bladder habits.    ROS: As per HPI.  All other pertinent ROS negative.     Past Medical History:  Diagnosis Date  . Acid reflux   . Hypertension   . Insomnia   . Muscle spasms of lower extremity   . Panic attacks    Past Surgical History:  Procedure Laterality Date  . ABDOMINAL HYSTERECTOMY    . PARTIAL HYSTERECTOMY     Allergies  Allergen Reactions  . Aspirin Hives   No current facility-administered medications on file prior to encounter.   Current Outpatient Medications on File Prior to Encounter  Medication Sig Dispense Refill  . hydrochlorothiazide (HYDRODIURIL) 12.5 MG tablet Take 12.5 mg by mouth daily.     Marland Kitchen omeprazole (PRILOSEC) 20 MG capsule Take 20 mg by mouth 2 (two) times daily before a meal.     . traZODone (DESYREL) 50 MG tablet Take 1 tablet (50 mg total) by mouth at bedtime. 30 tablet 2  . escitalopram (LEXAPRO) 10 MG tablet Take 1 tablet (10 mg total) by mouth daily. 30 tablet 2   Social History   Socioeconomic History  . Marital status: Single    Spouse name: Not on file  . Number of children: 1  . Years of education: Not on file  . Highest education level: Not on file  Occupational History  . Not on file  Tobacco Use  . Smoking status: Former Smoker      Packs/day: 0.50    Types: Cigarettes    Quit date: 03/06/2017    Years since quitting: 2.3  . Smokeless tobacco: Never Used  Substance and Sexual Activity  . Alcohol use: Not Currently    Comment: occ  . Drug use: No  . Sexual activity: Yes    Birth control/protection: Surgical  Other Topics Concern  . Not on file  Social History Narrative  . Not on file   Social Determinants of Health   Financial Resource Strain:   . Difficulty of Paying Living Expenses:   Food Insecurity:   . Worried About Charity fundraiser in the Last Year:   . Arboriculturist in the Last Year:   Transportation Needs:   . Film/video editor (Medical):   Marland Kitchen Lack of Transportation (Non-Medical):   Physical Activity:   . Days of Exercise per Week:   . Minutes of Exercise per Session:   Stress:   . Feeling of Stress :   Social Connections:   . Frequency of Communication with Friends and Family:   . Frequency of Social Gatherings with Friends and Family:   . Attends Religious Services:   . Active Member of Clubs or Organizations:   . Attends Archivist Meetings:   Marland Kitchen Marital Status:  Intimate Partner Violence:   . Fear of Current or Ex-Partner:   . Emotionally Abused:   Marland Kitchen Physically Abused:   . Sexually Abused:    Family History  Problem Relation Age of Onset  . Atrial fibrillation Mother   . Hypertension Mother   . Diabetes Mother   . Aortic aneurysm Father   . Down syndrome Sister   . Healthy Brother   . Hypertension Maternal Grandfather   . Stroke Maternal Grandfather   . Healthy Brother   . Healthy Brother   . Autism Son     OBJECTIVE:  Vitals:   07/08/19 1438  BP: (!) 142/90  Pulse: 90  Resp: 16  Temp: 99.1 F (37.3 C)  TempSrc: Oral  SpO2: 97%    Physical Exam Vitals reviewed.  Constitutional:      General: She is not in acute distress.    Appearance: Normal appearance. She is normal weight. She is not ill-appearing, toxic-appearing or diaphoretic.   HENT:     Head: Normocephalic.     Right Ear: Ear canal and external ear normal. A middle ear effusion is present. There is no impacted cerumen.     Left Ear: Hearing and external ear normal.     Ears:     Comments: Pain when pulling ear tragus, erythematoeus ear canal.  Unable to visualize TM Cardiovascular:     Rate and Rhythm: Normal rate and regular rhythm.     Pulses: Normal pulses.     Heart sounds: Normal heart sounds. No murmur. No friction rub. No gallop.   Pulmonary:     Effort: Pulmonary effort is normal. No respiratory distress.     Breath sounds: Normal breath sounds. No stridor. No wheezing, rhonchi or rales.  Chest:     Chest wall: No tenderness.  Neurological:     Mental Status: She is alert.      Imaging: No results found.   ASSESSMENT & PLAN:  1. Infective otitis externa of left ear   2. Middle ear effusion, right     Meds ordered this encounter  Medications  . ciprofloxacin-dexamethasone (CIPRODEX) OTIC suspension    Sig: Place 4 drops into the left ear 2 (two) times daily.    Dispense:  7.5 mL    Refill:  0  . fluticasone (FLONASE) 50 MCG/ACT nasal spray    Sig: Place 1 spray into both nostrils daily for 14 days.    Dispense:  16 g    Refill:  0   Discharge instructions Rest and drink plenty of fluids Prescribed Ciprodex for left ear  Flonase for middle ear effusion Take medications as directed and to completion Continue to use OTC ibuprofen and/ or tylenol as needed for pain control Follow up with PCP if symptoms persists Return here or go to the ER if you have any new or worsening symptoms   Reviewed expectations re: course of current medical issues. Questions answered. Outlined signs and symptoms indicating need for more acute intervention. Patient verbalized understanding. After Visit Summary given.         Durward Parcel, FNP 07/08/19 1507

## 2019-07-08 NOTE — Discharge Instructions (Signed)
Rest and drink plenty of fluids Prescribed Ciprodex for left ear  Flonase for middle ear effusion Take medications as directed and to completion Continue to use OTC ibuprofen and/ or tylenol as needed for pain control Follow up with PCP if symptoms persists Return here or go to the ER if you have any new or worsening symptoms

## 2019-07-08 NOTE — ED Triage Notes (Signed)
Pt has been having sharp ear pain in the left ear for about 2-3 days.

## 2019-07-09 ENCOUNTER — Other Ambulatory Visit: Payer: Self-pay

## 2019-07-09 ENCOUNTER — Inpatient Hospital Stay (HOSPITAL_BASED_OUTPATIENT_CLINIC_OR_DEPARTMENT_OTHER): Payer: Medicare Other | Admitting: Hematology

## 2019-07-09 VITALS — BP 137/91 | HR 94 | Temp 97.3°F | Resp 18 | Wt 117.3 lb

## 2019-07-09 DIAGNOSIS — D72829 Elevated white blood cell count, unspecified: Secondary | ICD-10-CM

## 2019-07-09 DIAGNOSIS — E559 Vitamin D deficiency, unspecified: Secondary | ICD-10-CM | POA: Diagnosis not present

## 2019-07-09 DIAGNOSIS — E538 Deficiency of other specified B group vitamins: Secondary | ICD-10-CM | POA: Diagnosis not present

## 2019-07-09 NOTE — Progress Notes (Signed)
[place] [address], [place] [address]   CLINIC:  Medical Oncology/Hematology  PCP:  Celene Squibb, MD [address] [place] [address]  [phone]  REASON FOR VISIT:  Follow-up for leukocytosis  CURRENT THERAPY: Observation.  INTERVAL HISTORY:  Christina Fernandez, a 49 y.o. female, returns for routine follow-up for her leukocytosis. Christina Fernandez was last seen on 03/29/2019.  She recently got hearing aids and went to [place] yesterday for infective otitis externa and was prescribed Cipro ear drops. She started smoking last month, though only a couple cigarettes every few days.   REVIEW OF SYSTEMS:  Review of Systems  Constitutional: Positive for fatigue (moderate). Negative for appetite change.  Psychiatric/Behavioral: Positive for sleep disturbance. The patient is nervous/anxious.   All other systems reviewed and are negative.   PAST MEDICAL/SURGICAL HISTORY:  Past Medical History:  Diagnosis Date  . Acid reflux   . Hypertension   . Insomnia   . Muscle spasms of lower extremity   . Panic attacks    Past Surgical History:  Procedure Laterality Date  . ABDOMINAL HYSTERECTOMY    . PARTIAL HYSTERECTOMY      SOCIAL HISTORY:  Social History   Socioeconomic History  . Marital status: Single    Spouse name: Not on file  . Number of children: 1  . Years of education: Not on file  . Highest education level: Not on file  Occupational History  . Not on file  Tobacco Use  . Smoking status: Former Smoker    Packs/day: 0.50    Types: Cigarettes    Quit date: 03/06/2017    Years since quitting: 2.3  . Smokeless tobacco: Never Used  Substance and Sexual Activity  . Alcohol use: Not Currently    Comment: occ  . Drug use: No  . Sexual activity: Yes    Birth control/protection: Surgical  Other Topics Concern  . Not on file  Social History Narrative  . Not on file   Social Determinants of Health   Financial Resource Strain:   .  Difficulty of Paying Living Expenses:   Food Insecurity:   . Worried About Charity fundraiser in the Last Year:   . Arboriculturist in the Last Year:   Transportation Needs:   . Film/video editor (Medical):   Marland Kitchen Lack of Transportation (Non-Medical):   Physical Activity:   . Days of Exercise per Week:   . Minutes of Exercise per Session:   Stress:   . Feeling of Stress :   Social Connections:   . Frequency of Communication with Friends and Family:   . Frequency of Social Gatherings with Friends and Family:   . Attends Religious Services:   . Active Member of Clubs or Organizations:   . Attends Archivist Meetings:   Marland Kitchen Marital Status:   Intimate Partner Violence:   . Fear of Current or Ex-Partner:   . Emotionally Abused:   Marland Kitchen Physically Abused:   . Sexually Abused:     FAMILY HISTORY:  Family History  Problem Relation Age of Onset  . Atrial fibrillation Mother   . Hypertension Mother   . Diabetes Mother   . Aortic aneurysm Father   . Down syndrome Sister   . Healthy Brother   . Hypertension Maternal Grandfather   . Stroke Maternal Grandfather   . Healthy Brother   . Healthy Brother   . Autism Son  CURRENT MEDICATIONS:  Current Outpatient Medications  Medication Sig Dispense Refill  . ciprofloxacin-dexamethasone (CIPRODEX) OTIC suspension Place 4 drops into the left ear 2 (two) times daily. 7.5 mL 0  . escitalopram (LEXAPRO) 10 MG tablet Take 1 tablet (10 mg total) by mouth daily. 30 tablet 2  . fluticasone (FLONASE) 50 MCG/ACT nasal spray Place 1 spray into both nostrils daily for 14 days. 16 g 0  . hydrochlorothiazide (HYDRODIURIL) 12.5 MG tablet Take 12.5 mg by mouth daily.     Marland Kitchen omeprazole (PRILOSEC) 20 MG capsule Take 20 mg by mouth 2 (two) times daily before a meal.     . traZODone (DESYREL) 50 MG tablet Take 1 tablet (50 mg total) by mouth at bedtime. 30 tablet 2   No current facility-administered medications for this visit.    ALLERGIES:    Allergies  Allergen Reactions  . Aspirin Hives    PHYSICAL EXAM:  Performance status (ECOG): 1 - Symptomatic but completely ambulatory  There were no vitals filed for this visit. Wt Readings from Last 3 Encounters:  03/29/19 110 lb (49.9 kg)  03/08/19 112 lb 2 oz (50.9 kg)  10/18/18 130 lb (59 kg)   Physical Exam Vitals reviewed.  Constitutional:      Appearance: Normal appearance.  Cardiovascular:     Rate and Rhythm: Normal rate and regular rhythm.     Pulses: Normal pulses.     Heart sounds: Normal heart sounds.  Pulmonary:     Effort: Pulmonary effort is normal.     Breath sounds: Normal breath sounds.  Neurological:     General: No focal deficit present.     Mental Status: She is alert and oriented to person, place, and time.  Psychiatric:        Mood and Affect: Mood normal.        Behavior: Behavior normal.     LABORATORY DATA:  I have reviewed the labs as listed.  CBC Latest Ref Rng & Units 07/03/2019 03/08/2019 11/25/2014  WBC 4.0 - 10.5 K/uL 11.7(H) 9.1 19.6(H)  Hemoglobin 12.0 - 15.0 g/dL 13.8 14.4 12.9  Hematocrit 36.0 - 46.0 % 42.6 44.5 37.4  Platelets 150 - 400 K/uL 311 303 211   CMP Latest Ref Rng & Units 03/08/2019 11/25/2014 03/24/2014  Glucose 70 - 99 mg/dL 97 101(H) 127(H)  BUN 6 - 20 mg/dL '7 11 21  ' Creatinine 0.44 - 1.00 mg/dL 0.41(L) 0.52 0.58  Sodium 135 - 145 mmol/L 140 136 135  Potassium 3.5 - 5.1 mmol/L 3.2(L) 3.6 3.8  Chloride 98 - 111 mmol/L 97(L) 104 106  CO2 22 - 32 mmol/L '31 25 26  ' Calcium 8.9 - 10.3 mg/dL 9.6 8.8(L) 9.3  Total Protein 6.5 - 8.1 g/dL 7.5 7.1 8.3  Total Bilirubin 0.3 - 1.2 mg/dL 0.7 0.4 0.9  Alkaline Phos 38 - 126 U/L 71 80 82  AST 15 - 41 U/L 18 117(H) 22  ALT 0 - 44 U/L 25 114(H) 34      Component Value Date/Time   RBC 4.71 07/03/2019 1405   MCV 90.4 07/03/2019 1405   MCV 89 09/08/2011 0537   MCH 29.3 07/03/2019 1405   MCHC 32.4 07/03/2019 1405   RDW 13.2 07/03/2019 1405   RDW 14.2 09/08/2011 0537   LYMPHSABS  2.0 07/03/2019 1405   LYMPHSABS 1.8 09/08/2011 0537   MONOABS 0.9 07/03/2019 1405   MONOABS 1.1 (H) 09/08/2011 0537   EOSABS 0.2 07/03/2019 1405   EOSABS 0.6 09/08/2011 0537  BASOSABS 0.1 07/03/2019 1405   BASOSABS 0.1 09/08/2011 0537    DIAGNOSTIC IMAGING:  I have independently reviewed the scans and discussed with the patient.   ASSESSMENT:  1.  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 -Blood work from 03/08/2019 shows negative results for JAK2 V617F and BCR/ABL testing. -Lupus and rheumatoid arthritis test was also negative. -She quit smoking a year ago but has restarted back in the last 1 month.   PLAN:  1.  Jak 2 and BCR/ABL negative resolved leukocytosis: -We reviewed results from 07/03/2019.  White count is 11.7.  Hemoglobin is 13.8 and platelet count is 311. -There is mild elevation of white count with neutrophils increased. -She does not have any B symptoms. -We will continue to monitor.  I will see her back in 6 months with repeat labs.  Consider bone marrow biopsy if there is any significant increase.  2.  Vitamin D deficiency: -Vitamin D level was 69. -Continue vitamin D 5000 units daily.  3.  Vitamin B12 deficiency: -Vitamin B12 level was 511. -Continue vitamin B12 1 mg tablet daily.   Orders placed this encounter:  No orders of the defined types were placed in this encounter.    Derek Jack, MD [place] [phone]   I, Milinda Antis, am acting as a scribe for Dr. Sanda Linger.  I, Derek Jack MD, have reviewed the above documentation for accuracy and completeness, and I agree with the above.

## 2019-07-09 NOTE — Patient Instructions (Signed)
[facility] at [facility] Discharge Instructions  You were seen today by Dr. Ellin Saba. He went over your recent results. Please come back to see Randi in 6 months for labs and follow up.   Thank you for choosing  Cancer Center at [facility] to provide your oncology and hematology care.  To afford each patient quality time with our provider, please arrive at least 15 minutes before your scheduled appointment time.   If you have a lab appointment with the Cancer Center please come in thru the Main Entrance and check in at the main information desk  You need to re-schedule your appointment should you arrive 10 or more minutes late.  We strive to give you quality time with our providers, and arriving late affects you and other patients whose appointments are after yours.  Also, if you no show three or more times for appointments you may be dismissed from the clinic at the providers discretion.     Again, thank you for choosing [facility].  Our hope is that these requests will decrease the amount of time that you wait before being seen by our physicians.       _____________________________________________________________  Should you have questions after your visit to [facility], please contact our office at [phone] between the hours of 8:00 a.m. and 4:30 p.m.  Voicemails left after 4:00 p.m. will not be returned until the following business day.  For prescription refill requests, have your pharmacy contact our office and allow 72 hours.    Cancer Center Support Programs:   > Cancer Support Group  2nd Tuesday of the month 1pm-2pm, Journey Room

## 2019-07-12 DIAGNOSIS — Z23 Encounter for immunization: Secondary | ICD-10-CM | POA: Diagnosis not present

## 2019-07-31 DIAGNOSIS — E876 Hypokalemia: Secondary | ICD-10-CM | POA: Diagnosis not present

## 2019-07-31 DIAGNOSIS — D72829 Elevated white blood cell count, unspecified: Secondary | ICD-10-CM | POA: Diagnosis not present

## 2019-07-31 DIAGNOSIS — F419 Anxiety disorder, unspecified: Secondary | ICD-10-CM | POA: Diagnosis not present

## 2019-07-31 DIAGNOSIS — K219 Gastro-esophageal reflux disease without esophagitis: Secondary | ICD-10-CM | POA: Diagnosis not present

## 2019-07-31 DIAGNOSIS — Z7251 High risk heterosexual behavior: Secondary | ICD-10-CM | POA: Diagnosis not present

## 2019-07-31 DIAGNOSIS — F331 Major depressive disorder, recurrent, moderate: Secondary | ICD-10-CM | POA: Diagnosis not present

## 2019-07-31 DIAGNOSIS — R7301 Impaired fasting glucose: Secondary | ICD-10-CM | POA: Diagnosis not present

## 2019-07-31 DIAGNOSIS — F17210 Nicotine dependence, cigarettes, uncomplicated: Secondary | ICD-10-CM | POA: Diagnosis not present

## 2019-08-09 DIAGNOSIS — Z23 Encounter for immunization: Secondary | ICD-10-CM | POA: Diagnosis not present

## 2019-10-27 ENCOUNTER — Encounter: Payer: Self-pay | Admitting: Emergency Medicine

## 2019-10-27 ENCOUNTER — Other Ambulatory Visit: Payer: Self-pay

## 2019-10-27 ENCOUNTER — Ambulatory Visit
Admission: EM | Admit: 2019-10-27 | Discharge: 2019-10-27 | Disposition: A | Discharge status: Home / Self Care | Payer: Medicare Other | Attending: Emergency Medicine | Admitting: Emergency Medicine

## 2019-10-27 DIAGNOSIS — H60501 Unspecified acute noninfective otitis externa, right ear: Secondary | ICD-10-CM

## 2019-10-27 MED ORDER — NEOMYCIN-POLYMYXIN-HC 3.5-10000-1 OT SUSP: 4.0000 [drp] | Freq: Three times a day (TID) | OTIC | 0 refills | Status: DC

## 2019-10-27 NOTE — Discharge Instructions (Addendum)
Rest and drink plenty of fluids Prescribed Corticosporin Take medications as directed and to completion Continue to use OTC ibuprofen and/ or tylenol as needed for pain control Follow up with PCP if symptoms persists Return here or go to the ER if you have any new or worsening symptoms 

## 2019-10-27 NOTE — ED Triage Notes (Signed)
RT ear has been drainage and feels like its going to close up. Pt has had this issue in the past

## 2019-10-27 NOTE — ED Provider Notes (Signed)
[facility]   [id number] 10/27/19 Arrival Time: 1412  CC:EAR PAIN  SUBJECTIVE: History from: patient.  Christina Fernandez is a 49 y.o. female who presents to the urgent care for complaint of right ear drainage and fullness for the past few days..  Denies a precipitating event, such as swimming or wearing ear plugs.  Patient states the pain is constant and achy in character.  Patient has tried OTC medication without relief.  Symptoms are made worse with lying down.  Denies similar symptoms in the past.  Denies fever, chills, fatigue, sinus pain, rhinorrhea, ear discharge, sore throat, SOB, wheezing, chest pain, nausea, changes in bowel or bladder habits.    ROS: As per HPI.  All other pertinent ROS negative.     Past Medical History:  Diagnosis Date  . Acid reflux   . Hypertension   . Insomnia   . Muscle spasms of lower extremity   . Panic attacks    Past Surgical History:  Procedure Laterality Date  . ABDOMINAL HYSTERECTOMY    . PARTIAL HYSTERECTOMY     Allergies  Allergen Reactions  . Aspirin Hives   No current facility-administered medications on file prior to encounter.   Current Outpatient Medications on File Prior to Encounter  Medication Sig Dispense Refill  . ciprofloxacin-dexamethasone (CIPRODEX) OTIC suspension Place 4 drops into the left ear 2 (two) times daily. 7.5 mL 0  . escitalopram (LEXAPRO) 10 MG tablet Take 1 tablet (10 mg total) by mouth daily. 30 tablet 2  . fluticasone (FLONASE) 50 MCG/ACT nasal spray Place 1 spray into both nostrils daily for 14 days. 16 g 0  . hydrochlorothiazide (HYDRODIURIL) 12.5 MG tablet Take 12.5 mg by mouth daily.     Marland Kitchen omeprazole (PRILOSEC) 20 MG capsule Take 20 mg by mouth 2 (two) times daily before a meal.     . traZODone (DESYREL) 50 MG tablet Take 1 tablet (50 mg total) by mouth at bedtime. (Patient not taking: Reported on 07/09/2019) 30 tablet 2   Social History   Socioeconomic History  . Marital status: Single     Spouse name: Not on file  . Number of children: 1  . Years of education: Not on file  . Highest education level: Not on file  Occupational History  . Not on file  Tobacco Use  . Smoking status: Former Smoker    Packs/day: 0.50    Types: Cigarettes    Quit date: 03/06/2017    Years since quitting: 2.6  . Smokeless tobacco: Never Used  Vaping Use  . Vaping Use: Never used  Substance and Sexual Activity  . Alcohol use: Not Currently    Comment: occ  . Drug use: No  . Sexual activity: Yes    Birth control/protection: Surgical  Other Topics Concern  . Not on file  Social History Narrative  . Not on file   Social Determinants of Health   Financial Resource Strain:   . Difficulty of Paying Living Expenses: Not on file  Food Insecurity:   . Worried About Programme researcher, broadcasting/film/video in the Last Year: Not on file  . Ran Out of Food in the Last Year: Not on file  Transportation Needs:   . Lack of Transportation (Medical): Not on file  . Lack of Transportation (Non-Medical): Not on file  Physical Activity:   . Days of Exercise per Week: Not on file  . Minutes of Exercise per Session: Not on file  Stress:   . Feeling  of Stress : Not on file  Social Connections:   . Frequency of Communication with Friends and Family: Not on file  . Frequency of Social Gatherings with Friends and Family: Not on file  . Attends Religious Services: Not on file  . Active Member of Clubs or Organizations: Not on file  . Attends Banker Meetings: Not on file  . Marital Status: Not on file  Intimate Partner Violence:   . Fear of Current or Ex-Partner: Not on file  . Emotionally Abused: Not on file  . Physically Abused: Not on file  . Sexually Abused: Not on file   Family History  Problem Relation Age of Onset  . Atrial fibrillation Mother   . Hypertension Mother   . Diabetes Mother   . Aortic aneurysm Father   . Down syndrome Sister   . Healthy Brother   . Hypertension Maternal Grandfather    . Stroke Maternal Grandfather   . Healthy Brother   . Healthy Brother   . Autism Son     OBJECTIVE:  Vitals:   10/27/19 1527 10/27/19 1528  BP: (!) 148/85   Pulse: 84   Resp: 17   Temp: 98.3 F (36.8 C)   TempSrc: Oral   SpO2: 97%   Weight:  110 lb (49.9 kg)  Height:  5\' 3"  (1.6 m)     Physical Exam Vitals and nursing note reviewed.  Constitutional:      General: She is not in acute distress.    Appearance: Normal appearance. She is normal weight. She is not ill-appearing, toxic-appearing or diaphoretic.  HENT:     Head: Normocephalic.     Right Ear: Tympanic membrane and external ear normal. Swelling and tenderness present.     Left Ear: Tympanic membrane, ear canal and external ear normal.     Ears:     Comments: Right ear: tenderness and swelling ear canal.  Pain when pulling ear tragus Cardiovascular:     Rate and Rhythm: Normal rate and regular rhythm.     Pulses: Normal pulses.     Heart sounds: Normal heart sounds. No murmur heard.  No friction rub. No gallop.   Pulmonary:     Effort: Pulmonary effort is normal. No respiratory distress.     Breath sounds: Normal breath sounds. No stridor. No wheezing, rhonchi or rales.  Chest:     Chest wall: No tenderness.  Neurological:     Mental Status: She is alert and oriented to person, place, and time.     Imaging: No results found.   ASSESSMENT & PLAN:  1. Acute otitis externa of right ear, unspecified type     Meds ordered this encounter  Medications  . neomycin-polymyxin-hydrocortisone (CORTISPORIN) 3.5-10000-1 OTIC suspension    Sig: Place 4 drops into the right ear 3 (three) times daily.    Dispense:  10 mL    Refill:  0    Rest and drink plenty of fluids Prescribed Corticosporin Take medications as directed and to completion Continue to use OTC ibuprofen and/ or tylenol as needed for pain control Follow up with PCP if symptoms persists Return here or go to the ER if you have any new or  worsening symptoms   Reviewed expectations re: course of current medical issues. Questions answered. Outlined signs and symptoms indicating need for more acute intervention. Patient verbalized understanding. After Visit Summary given.         09-27-2002, FNP 10/27/19 1610

## 2019-11-27 DIAGNOSIS — Z23 Encounter for immunization: Secondary | ICD-10-CM | POA: Diagnosis not present

## 2020-01-09 ENCOUNTER — Other Ambulatory Visit (HOSPITAL_COMMUNITY): Payer: Medicare Other

## 2020-01-16 ENCOUNTER — Ambulatory Visit (HOSPITAL_COMMUNITY): Payer: Medicare Other | Admitting: Nurse Practitioner

## 2020-01-23 ENCOUNTER — Inpatient Hospital Stay (HOSPITAL_COMMUNITY): Payer: Medicare Other | Attending: Medical

## 2020-01-23 ENCOUNTER — Inpatient Hospital Stay (HOSPITAL_COMMUNITY): Payer: Medicare Other

## 2020-01-23 ENCOUNTER — Other Ambulatory Visit: Payer: Self-pay

## 2020-01-23 DIAGNOSIS — E538 Deficiency of other specified B group vitamins: Secondary | ICD-10-CM | POA: Insufficient documentation

## 2020-01-23 DIAGNOSIS — D72829 Elevated white blood cell count, unspecified: Secondary | ICD-10-CM | POA: Insufficient documentation

## 2020-01-23 DIAGNOSIS — E559 Vitamin D deficiency, unspecified: Secondary | ICD-10-CM | POA: Diagnosis not present

## 2020-01-23 DIAGNOSIS — F17210 Nicotine dependence, cigarettes, uncomplicated: Secondary | ICD-10-CM | POA: Diagnosis not present

## 2020-01-23 LAB — CBC WITH DIFFERENTIAL/PLATELET
Abs Immature Granulocytes: 0.05 10*3/uL (ref 0.00–0.07)
Basophils Absolute: 0.1 10*3/uL (ref 0.0–0.1)
Basophils Relative: 1 %
Eosinophils Absolute: 0.1 10*3/uL (ref 0.0–0.5)
Eosinophils Relative: 1 %
HCT: 40.6 % (ref 36.0–46.0)
Hemoglobin: 13.2 g/dL (ref 12.0–15.0)
Immature Granulocytes: 0 %
Lymphocytes Relative: 15 %
Lymphs Abs: 1.9 10*3/uL (ref 0.7–4.0)
MCH: 29.5 pg (ref 26.0–34.0)
MCHC: 32.5 g/dL (ref 30.0–36.0)
MCV: 90.6 fL (ref 80.0–100.0)
Monocytes Absolute: 1 10*3/uL (ref 0.1–1.0)
Monocytes Relative: 8 %
Neutro Abs: 9.5 10*3/uL — ABNORMAL HIGH (ref 1.7–7.7)
Neutrophils Relative %: 75 %
Platelets: 273 10*3/uL (ref 150–400)
RBC: 4.48 MIL/uL (ref 3.87–5.11)
RDW: 14.7 % (ref 11.5–15.5)
WBC: 12.7 10*3/uL — ABNORMAL HIGH (ref 4.0–10.5)
nRBC: 0 % (ref 0.0–0.2)

## 2020-01-23 LAB — COMPREHENSIVE METABOLIC PANEL
ALT: 16 U/L (ref 0–44)
AST: 13 U/L — ABNORMAL LOW (ref 15–41)
Albumin: 4 g/dL (ref 3.5–5.0)
Alkaline Phosphatase: 78 U/L (ref 38–126)
Anion gap: 9 (ref 5–15)
BUN: 12 mg/dL (ref 6–20)
CO2: 27 mmol/L (ref 22–32)
Calcium: 9.3 mg/dL (ref 8.9–10.3)
Chloride: 99 mmol/L (ref 98–111)
Creatinine, Ser: 0.48 mg/dL (ref 0.44–1.00)
GFR, Estimated: >60 mL/min (ref >60)
Glucose, Bld: 93 mg/dL (ref 70–99)
Potassium: 3.4 mmol/L — ABNORMAL LOW (ref 3.5–5.1)
Sodium: 135 mmol/L (ref 135–145)
Total Bilirubin: 0.9 mg/dL (ref 0.3–1.2)
Total Protein: 7.2 g/dL (ref 6.5–8.1)

## 2020-01-23 LAB — LACTATE DEHYDROGENASE: LDH: 103 U/L (ref 98–192)

## 2020-01-30 ENCOUNTER — Other Ambulatory Visit: Payer: Self-pay

## 2020-01-30 ENCOUNTER — Encounter (HOSPITAL_COMMUNITY): Payer: Self-pay | Admitting: Hematology

## 2020-01-30 ENCOUNTER — Inpatient Hospital Stay (HOSPITAL_BASED_OUTPATIENT_CLINIC_OR_DEPARTMENT_OTHER): Payer: Medicare Other | Admitting: Hematology

## 2020-01-30 VITALS — BP 125/87 | HR 103 | Temp 98.3°F | Resp 17 | Wt 115.0 lb

## 2020-01-30 DIAGNOSIS — D72829 Elevated white blood cell count, unspecified: Secondary | ICD-10-CM | POA: Diagnosis not present

## 2020-01-30 DIAGNOSIS — F17210 Nicotine dependence, cigarettes, uncomplicated: Secondary | ICD-10-CM | POA: Diagnosis not present

## 2020-01-30 DIAGNOSIS — E538 Deficiency of other specified B group vitamins: Secondary | ICD-10-CM | POA: Diagnosis not present

## 2020-01-30 DIAGNOSIS — E559 Vitamin D deficiency, unspecified: Secondary | ICD-10-CM | POA: Diagnosis not present

## 2020-01-30 NOTE — Patient Instructions (Signed)
[facility] at [facility] Discharge Instructions  You were seen today by Dr. Ellin Saba. He went over your recent results. Your next appointment will be in 6 months with the nurse practitioner for labs and follow up.   Thank you for choosing [facility] at [facility] to provide your oncology and hematology care.  To afford each patient quality time with our provider, please arrive at least 15 minutes before your scheduled appointment time.   If you have a lab appointment with the Cancer Center please come in thru the Main Entrance and check in at the main information desk  You need to re-schedule your appointment should you arrive 10 or more minutes late.  We strive to give you quality time with our providers, and arriving late affects you and other patients whose appointments are after yours.  Also, if you no show three or more times for appointments you may be dismissed from the clinic at the providers discretion.     Again, thank you for choosing [facility].  Our hope is that these requests will decrease the amount of time that you wait before being seen by our physicians.       _____________________________________________________________  Should you have questions after your visit to [facility], please contact our office at [phone] between the hours of 8:00 a.m. and 4:30 p.m.  Voicemails left after 4:00 p.m. will not be returned until the following business day.  For prescription refill requests, have your pharmacy contact our office and allow 72 hours.    Cancer Center Support Programs:   > Cancer Support Group  2nd Tuesday of the month 1pm-2pm, Journey Room

## 2020-01-30 NOTE — Progress Notes (Signed)
Christina Fernandez, Christina Fernandez [id number]   CLINIC:  Medical Oncology/Hematology  PCP:  Celene Squibb, MD [address] [place] [address]  [phone]  REASON FOR VISIT:  Follow-up for leukocytosis  PRIOR THERAPY: None  CURRENT THERAPY: Observation  INTERVAL HISTORY:  [name], a 49 y.o. female, returns for routine follow-up for her leukocytosis. Christina Fernandez was last seen on 07/09/2019.  Today she reports feeling well. She denies having any recent infections, F/C, night sweats, nosebleeds, hematuria or hematochezia. She continues taking vitamin D and B12 1 mg QD. She is not currently taking any steroids.  She continues to smoke occasionally.   REVIEW OF SYSTEMS:  Review of Systems  Constitutional: Positive for fatigue (90%). Negative for appetite change, chills, diaphoresis and fever.  HENT:   Negative for nosebleeds.   Respiratory: Positive for shortness of breath.   Gastrointestinal: Negative for blood in stool.  Genitourinary: Negative for hematuria.     PAST MEDICAL/SURGICAL HISTORY:  Past Medical History:  Diagnosis Date  . Acid reflux   . Hypertension   . Insomnia   . Muscle spasms of lower extremity   . Panic attacks    Past Surgical History:  Procedure Laterality Date  . ABDOMINAL HYSTERECTOMY    . PARTIAL HYSTERECTOMY      SOCIAL HISTORY:  Social History   Socioeconomic History  . Marital status: Single    Spouse name: Not on file  . Number of children: 1  . Years of education: Not on file  . Highest education level: Not on file  Occupational History  . Not on file  Tobacco Use  . Smoking status: Former Smoker    Packs/day: 0.50    Types: Cigarettes    Quit date: 03/06/2017    Years since quitting: 2.9  . Smokeless tobacco: Never Used  Vaping Use  . Vaping Use: Never used  Substance and Sexual Activity  . Alcohol use: Not Currently    Comment: occ  . Drug use: No  . Sexual activity: Yes    Birth  control/protection: Surgical  Other Topics Concern  . Not on file  Social History Narrative  . Not on file   Social Determinants of Health   Financial Resource Strain: Low Risk   . Difficulty of Paying Living Expenses: Not hard at all  Food Insecurity: No Food Insecurity  . Worried About Charity fundraiser in the Last Year: Never true  . Ran Out of Food in the Last Year: Never true  Transportation Needs: No Transportation Needs  . Lack of Transportation (Medical): No  . Lack of Transportation (Non-Medical): No  Physical Activity: Inactive  . Days of Exercise per Week: 0 days  . Minutes of Exercise per Session: 0 min  Stress: No Stress Concern Present  . Feeling of Stress : Not at all  Social Connections: Socially Isolated  . Frequency of Communication with Friends and Family: More than three times a week  . Frequency of Social Gatherings with Friends and Family: Three times a week  . Attends Religious Services: Never  . Active Member of Clubs or Organizations: No  . Attends Archivist Meetings: Never  . Marital Status: Never married  Intimate Partner Violence: Not At Risk  . Fear of Current or Ex-Partner: No  . Emotionally Abused: No  . Physically Abused: No  . Sexually Abused: No    FAMILY HISTORY:  Family History  Problem Relation Age of Onset  . Atrial fibrillation Mother   . Hypertension Mother   . Diabetes Mother   . Aortic aneurysm Father   . Down syndrome Sister   . Healthy Brother   . Hypertension Maternal Grandfather   . Stroke Maternal Grandfather   . Healthy Brother   . Healthy Brother   . Autism Son     CURRENT MEDICATIONS:  Current Outpatient Medications  Medication Sig Dispense Refill  . hydrochlorothiazide (HYDRODIURIL) 12.5 MG tablet Take 12.5 mg by mouth daily.     Marland Kitchen omeprazole (PRILOSEC) 20 MG capsule Take 20 mg by mouth 2 (two) times daily before a meal.     . traZODone (DESYREL) 50 MG tablet Take 1 tablet (50 mg total) by mouth at  bedtime. 30 tablet 2  . escitalopram (LEXAPRO) 10 MG tablet Take 1 tablet (10 mg total) by mouth daily. 30 tablet 2   No current facility-administered medications for this visit.    ALLERGIES:  Allergies  Allergen Reactions  . Aspirin Hives    PHYSICAL EXAM:  Performance status (ECOG): 1 - Symptomatic but completely ambulatory  Vitals:   01/30/20 1125  BP: 125/87  Pulse: (!) 103  Resp: 17  Temp: 98.3 F (36.8 C)  SpO2: 100%   Wt Readings from Last 3 Encounters:  01/30/20 115 lb (52.2 kg)  10/27/19 110 lb (49.9 kg)  07/09/19 117 lb 4.8 oz (53.2 kg)   Physical Exam Vitals reviewed.  Constitutional:      Appearance: Normal appearance.  Cardiovascular:     Rate and Rhythm: Normal rate and regular rhythm.     Pulses: Normal pulses.     Heart sounds: Normal heart sounds.  Pulmonary:     Effort: Pulmonary effort is normal.     Breath sounds: Normal breath sounds.  Chest:  Breasts:     Right: No axillary adenopathy or supraclavicular adenopathy.     Left: No axillary adenopathy or supraclavicular adenopathy.    Abdominal:     Palpations: Abdomen is soft. There is no hepatomegaly, splenomegaly or mass.     Tenderness: There is no abdominal tenderness.     Hernia: No hernia is present.  Musculoskeletal:     Right lower leg: No edema.     Left lower leg: No edema.  Lymphadenopathy:     Cervical: No cervical adenopathy.     Upper Body:     Right upper body: No supraclavicular, axillary or pectoral adenopathy.     Left upper body: No supraclavicular, axillary or pectoral adenopathy.     Lower Body: No right inguinal adenopathy. No left inguinal adenopathy.  Neurological:     General: No focal deficit present.     Mental Status: She is alert and oriented to person, place, and time.  Psychiatric:        Mood and Affect: Mood normal.        Behavior: Behavior normal.     LABORATORY DATA:  I have reviewed the labs as listed.  CBC Latest Ref Rng & Units 01/23/2020  07/03/2019 03/08/2019  WBC 4.0 - 10.5 K/uL 12.7(H) 11.7(H) 9.1  Hemoglobin 12.0 - 15.0 g/dL 13.2 13.8 14.4  Hematocrit 36.0 - 46.0 % 40.6 42.6 44.5  Platelets 150 - 400 K/uL 273 311 303   CMP Latest Ref Rng & Units 01/23/2020 03/08/2019 11/25/2014  Glucose 70 - 99 mg/dL 93 97 101(H)  BUN 6 - 20 mg/dL '12 7 11  ' Creatinine 0.44 - 1.00 mg/dL  0.48 0.41(L) 0.52  Sodium 135 - 145 mmol/L 135 140 136  Potassium 3.5 - 5.1 mmol/L 3.4(L) 3.2(L) 3.6  Chloride 98 - 111 mmol/L 99 97(L) 104  CO2 22 - 32 mmol/L '27 31 25  ' Calcium 8.9 - 10.3 mg/dL 9.3 9.6 8.8(L)  Total Protein 6.5 - 8.1 g/dL 7.2 7.5 7.1  Total Bilirubin 0.3 - 1.2 mg/dL 0.9 0.7 0.4  Alkaline Phos 38 - 126 U/L 78 71 80  AST 15 - 41 U/L 13(L) 18 117(H)  ALT 0 - 44 U/L 16 25 114(H)      Component Value Date/Time   RBC 4.48 01/23/2020 1323   MCV 90.6 01/23/2020 1323   MCV 89 09/08/2011 0537   MCH 29.5 01/23/2020 1323   MCHC 32.5 01/23/2020 1323   RDW 14.7 01/23/2020 1323   RDW 14.2 09/08/2011 0537   LYMPHSABS 1.9 01/23/2020 1323   LYMPHSABS 1.8 09/08/2011 0537   MONOABS 1.0 01/23/2020 1323   MONOABS 1.1 (H) 09/08/2011 0537   EOSABS 0.1 01/23/2020 1323   EOSABS 0.6 09/08/2011 0537   BASOSABS 0.1 01/23/2020 1323   BASOSABS 0.1 09/08/2011 0537   Lab Results  Component Value Date   LDH 103 01/23/2020   LDH 97 (L) 03/08/2019    DIAGNOSTIC IMAGING:  I have independently reviewed the scans and discussed with the patient. No results found.   ASSESSMENT:  1. 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 -Blood work from 03/08/2019 shows negative results for JAK2 V617F and BCR/ABL testing. -Lupus and rheumatoid arthritis test was also negative. -She quit smoking a year ago but has restarted back in the last 1 month.   PLAN:  1. Jak 2 and BCR/ABL negative resolved leukocytosis: -She is continuing to smoke cigarettes on and off. -Reviewed labs from 01/23/2020.   White count is 12.7 with normal hemoglobin and platelets.  Differential shows elevated neutrophils.  LDH normal. -Physical exam was normal.  No B symptoms. -RTC 6 months with repeat labs.  If there is significant worsening, consider bone marrow biopsy.  2. Vitamin D deficiency: -Vitamin D level was 69.  Continue vitamin D 5000 units daily.  Will check next visit.  3. Vitamin B12 deficiency: -Continue B12 1 mg tablet daily.  Will check next visit.  Orders placed this encounter:  No orders of the defined types were placed in this encounter.    Derek Jack, MD [place] [phone]   I, Milinda Antis, am acting as a scribe for Dr. Sanda Linger.  I, Derek Jack MD, have reviewed the above documentation for accuracy and completeness, and I agree with the above.

## 2020-02-19 DIAGNOSIS — E876 Hypokalemia: Secondary | ICD-10-CM | POA: Diagnosis not present

## 2020-02-19 DIAGNOSIS — R7301 Impaired fasting glucose: Secondary | ICD-10-CM | POA: Diagnosis not present

## 2020-02-19 DIAGNOSIS — D72829 Elevated white blood cell count, unspecified: Secondary | ICD-10-CM | POA: Diagnosis not present

## 2020-02-19 DIAGNOSIS — K219 Gastro-esophageal reflux disease without esophagitis: Secondary | ICD-10-CM | POA: Diagnosis not present

## 2020-03-03 DIAGNOSIS — M791 Myalgia, unspecified site: Secondary | ICD-10-CM | POA: Diagnosis not present

## 2020-03-03 DIAGNOSIS — U071 COVID-19: Secondary | ICD-10-CM | POA: Diagnosis not present

## 2020-03-03 DIAGNOSIS — R059 Cough, unspecified: Secondary | ICD-10-CM | POA: Diagnosis not present

## 2020-03-03 DIAGNOSIS — R0981 Nasal congestion: Secondary | ICD-10-CM | POA: Diagnosis not present

## 2020-05-25 ENCOUNTER — Ambulatory Visit
Admission: EM | Admit: 2020-05-25 | Discharge: 2020-05-25 | Disposition: A | Discharge status: Home / Self Care | Payer: Medicare Other | Attending: Physician Assistant | Admitting: Physician Assistant

## 2020-05-25 ENCOUNTER — Other Ambulatory Visit: Payer: Self-pay

## 2020-05-25 ENCOUNTER — Encounter: Payer: Self-pay | Admitting: Emergency Medicine

## 2020-05-25 DIAGNOSIS — H60391 Other infective otitis externa, right ear: Secondary | ICD-10-CM

## 2020-05-25 MED ORDER — PREDNISONE 10 MG (21) PO TBPK: ORAL_TABLET | Freq: Every day | ORAL | 0 refills | Status: DC

## 2020-05-25 MED ORDER — CIPROFLOXACIN-HYDROCORTISONE 0.2-1 % OT SUSP: 4.0000 [drp] | Freq: Two times a day (BID) | OTIC | Status: AC

## 2020-05-25 NOTE — ED Provider Notes (Signed)
[facility]    CSN: [id number] Arrival date & time: 05/25/20  1019      History   Chief Complaint Chief Complaint  Patient presents with  . Ear Drainage    HPI Christina Fernandez is a 50 y.o. female.   Who presents with swelling, drainage and pain of the right ear x 3 days.  She has had this in the left ear in the past and actually had a consult with ENT, but was unable to go. She has not rescheduled the appt. Her pain is ongoing. No fevers or cold symptoms. The drainage and swelling is constant.      Past Medical History:  Diagnosis Date  . Acid reflux   . Hypertension   . Insomnia   . Muscle spasms of lower extremity   . Panic attacks     Patient Active Problem List   Diagnosis Date Noted  . Leukocytosis 03/08/2019    Past Surgical History:  Procedure Laterality Date  . ABDOMINAL HYSTERECTOMY    . PARTIAL HYSTERECTOMY      OB History   No obstetric history on file.      Home Medications    Prior to Admission medications   Medication Sig Start Date End Date Taking? Authorizing Provider  predniSONE (STERAPRED UNI-PAK 21 TAB) 10 MG (21) TBPK tablet Take by mouth daily. Take 6 tabs by mouth daily  for 2 days, then 5 tabs for 2 days, then 4 tabs for 2 days, then 3 tabs for 2 days, 2 tabs for 2 days, then 1 tab by mouth daily for 2 days 05/25/20  Yes Kimbly Eanes, Dillard Cannon, PA-C  escitalopram (LEXAPRO) 10 MG tablet Take 1 tablet (10 mg total) by mouth daily. 09/07/16 07/09/19  Myrlene Broker, MD  hydrochlorothiazide (HYDRODIURIL) 12.5 MG tablet Take 12.5 mg by mouth daily.  09/07/18   [provider]  omeprazole (PRILOSEC) 20 MG capsule Take 20 mg by mouth 2 (two) times daily before a meal.  09/07/18   [provider]  traZODone (DESYREL) 50 MG tablet Take 1 tablet (50 mg total) by mouth at bedtime. 09/07/16   Myrlene Broker, MD    Family History Family History  Problem Relation Age of Onset  . Atrial fibrillation Mother   . Hypertension Mother    . Diabetes Mother   . Aortic aneurysm Father   . Down syndrome Sister   . Healthy Brother   . Hypertension Maternal Grandfather   . Stroke Maternal Grandfather   . Healthy Brother   . Healthy Brother   . Autism Son     Social History Social History   Tobacco Use  . Smoking status: Former Smoker    Packs/day: 0.50    Types: Cigarettes    Quit date: 03/06/2017    Years since quitting: 3.2  . Smokeless tobacco: Never Used  Vaping Use  . Vaping Use: Never used  Substance Use Topics  . Alcohol use: Not Currently    Comment: occ  . Drug use: No     Allergies   Aspirin   Review of Systems Review of Systems  Constitutional: Negative for fever.  Skin: Negative.   All other systems reviewed and are negative.    Physical Exam Triage Vital Signs ED Triage Vitals [05/25/20 1030]  Enc Vitals Group     BP (!) 169/100     Pulse Rate 82     Resp 18     Temp 98 F (36.7  C)     Temp Source Oral     SpO2 97 %     Weight      Height      Head Circumference      Peak Flow      Pain Score 4     Pain Loc      Pain Edu?      Excl. in GC?    No data found.  Updated Vital Signs BP (!) 169/100 (BP Location: Right Arm) Comment: has not taken her bp meds today  Pulse 82   Temp 98 F (36.7 C) (Oral)   Resp 18   SpO2 97%   Visual Acuity Right Eye Distance:   Left Eye Distance:   Bilateral Distance:    Right Eye Near:   Left Eye Near:    Bilateral Near:     Physical Exam Vitals and nursing note reviewed.  Constitutional:      General: She is not in acute distress.    Appearance: Normal appearance. She is not ill-appearing.  HENT:     Ears:     Comments: Right ear with swelling and pain to palpation along the tragus and pinna, +pre and post auricular nodes, tender to palpation. TM intact with limited visual Cardiovascular:     Rate and Rhythm: Normal rate.  Pulmonary:     Effort: Pulmonary effort is normal.  Skin:    General: Skin is warm and dry.   Neurological:     General: No focal deficit present.     Mental Status: She is alert.  Psychiatric:        Mood and Affect: Mood normal.        Behavior: Behavior normal.      UC Treatments / Results  Labs (all labs ordered are listed, but only abnormal results are displayed) Labs Reviewed - No data to display  EKG   Radiology No results found.  Procedures Procedures (including critical care time)  Medications Ordered in UC Medications  ciprofloxacin-hydrocortisone (CIPRO HC OTIC) 0.2-1 % OTIC (EAR) suspension 4 drop (has no administration in time range)    Initial Impression / Assessment and Plan / UC Course  I have reviewed the triage vital signs and the nursing notes.  Pertinent labs & imaging results that were available during my care of the patient were reviewed by me and considered in my medical decision making (see chart for details).     Moderate right ear otits externa. Unable to place a wic, instructions given to have someone open ear and place drops with her on side for at least 10 minutes. Prednisone given for swelling. Should this worsen in the next 1-2 days she must be seen in ED.  Final Clinical Impressions(s) / UC Diagnoses   Final diagnoses:  Infective otitis externa of right ear     Discharge Instructions     Start prednisone today. Give drops in right ear, have someone open ear and put them in. Lay on that side for at least 10 minutes to absorb, If this worsens then go to the ED for further evaluation.    ED Prescriptions    Medication Sig Dispense Auth. Provider   predniSONE (STERAPRED UNI-PAK 21 TAB) 10 MG (21) TBPK tablet Take by mouth daily. Take 6 tabs by mouth daily  for 2 days, then 5 tabs for 2 days, then 4 tabs for 2 days, then 3 tabs for 2 days, 2 tabs for 2 days, then 1 tab by  mouth daily for 2 days 42 tablet Riki Sheer, [place]     PDMP not reviewed this encounter.   Riki Sheer, PA-C 05/25/20 1103

## 2020-05-25 NOTE — ED Triage Notes (Addendum)
RT ear pain and drainage for a couple days

## 2020-05-25 NOTE — Discharge Instructions (Addendum)
Start prednisone today. Give drops in right ear, have someone open ear and put them in. Lay on that side for at least 10 minutes to absorb, If this worsens then go to the ED for further evaluation.

## 2020-06-17 DIAGNOSIS — H6063 Unspecified chronic otitis externa, bilateral: Secondary | ICD-10-CM | POA: Diagnosis not present

## 2020-06-17 DIAGNOSIS — E041 Nontoxic single thyroid nodule: Secondary | ICD-10-CM | POA: Diagnosis not present

## 2020-07-09 DIAGNOSIS — H6063 Unspecified chronic otitis externa, bilateral: Secondary | ICD-10-CM | POA: Diagnosis not present

## 2020-07-09 DIAGNOSIS — H6120 Impacted cerumen, unspecified ear: Secondary | ICD-10-CM | POA: Diagnosis not present

## 2020-08-12 ENCOUNTER — Other Ambulatory Visit: Payer: Self-pay

## 2020-08-12 ENCOUNTER — Inpatient Hospital Stay (HOSPITAL_COMMUNITY): Payer: Medicare Other

## 2020-08-12 ENCOUNTER — Inpatient Hospital Stay (HOSPITAL_COMMUNITY): Payer: Medicare Other | Attending: Hematology

## 2020-08-12 DIAGNOSIS — Z87891 Personal history of nicotine dependence: Secondary | ICD-10-CM | POA: Insufficient documentation

## 2020-08-12 DIAGNOSIS — E538 Deficiency of other specified B group vitamins: Secondary | ICD-10-CM | POA: Diagnosis not present

## 2020-08-12 DIAGNOSIS — I10 Essential (primary) hypertension: Secondary | ICD-10-CM | POA: Insufficient documentation

## 2020-08-12 DIAGNOSIS — Z79899 Other long term (current) drug therapy: Secondary | ICD-10-CM | POA: Insufficient documentation

## 2020-08-12 DIAGNOSIS — D72829 Elevated white blood cell count, unspecified: Secondary | ICD-10-CM | POA: Diagnosis not present

## 2020-08-12 DIAGNOSIS — E559 Vitamin D deficiency, unspecified: Secondary | ICD-10-CM | POA: Insufficient documentation

## 2020-08-12 DIAGNOSIS — Z20822 Contact with and (suspected) exposure to covid-19: Secondary | ICD-10-CM | POA: Diagnosis not present

## 2020-08-12 LAB — CBC WITH DIFFERENTIAL/PLATELET
Abs Immature Granulocytes: 0.05 10*3/uL (ref 0.00–0.07)
Basophils Absolute: 0.1 10*3/uL (ref 0.0–0.1)
Basophils Relative: 1 %
Eosinophils Absolute: 0.2 10*3/uL (ref 0.0–0.5)
Eosinophils Relative: 1 %
HCT: 42.9 % (ref 36.0–46.0)
Hemoglobin: 14 g/dL (ref 12.0–15.0)
Immature Granulocytes: 1 %
Lymphocytes Relative: 12 %
Lymphs Abs: 1.2 10*3/uL (ref 0.7–4.0)
MCH: 29.3 pg (ref 26.0–34.0)
MCHC: 32.6 g/dL (ref 30.0–36.0)
MCV: 89.7 fL (ref 80.0–100.0)
Monocytes Absolute: 1.3 10*3/uL — ABNORMAL HIGH (ref 0.1–1.0)
Monocytes Relative: 12 %
Neutro Abs: 7.7 10*3/uL (ref 1.7–7.7)
Neutrophils Relative %: 73 %
Platelets: 247 10*3/uL (ref 150–400)
RBC: 4.78 MIL/uL (ref 3.87–5.11)
RDW: 13.8 % (ref 11.5–15.5)
WBC: 10.5 10*3/uL (ref 4.0–10.5)
nRBC: 0 % (ref 0.0–0.2)

## 2020-08-12 LAB — VITAMIN D 25 HYDROXY (VIT D DEFICIENCY, FRACTURES): Vit D, 25-Hydroxy: 82.89 ng/mL (ref 30–100)

## 2020-08-12 LAB — VITAMIN B12: Vitamin B-12: 1315 pg/mL — ABNORMAL HIGH (ref 180–914)

## 2020-08-19 ENCOUNTER — Ambulatory Visit (HOSPITAL_COMMUNITY): Payer: Medicare Other | Admitting: Hematology and Oncology

## 2020-08-26 ENCOUNTER — Encounter: Payer: Self-pay | Admitting: Orthopaedic Surgery

## 2020-08-26 ENCOUNTER — Other Ambulatory Visit: Payer: Self-pay

## 2020-08-26 ENCOUNTER — Ambulatory Visit: Payer: Medicare Other

## 2020-08-26 ENCOUNTER — Ambulatory Visit (INDEPENDENT_AMBULATORY_CARE_PROVIDER_SITE_OTHER): Payer: Medicare Other | Admitting: Orthopaedic Surgery

## 2020-08-26 VITALS — BP 169/103 | HR 100 | Ht 63.0 in | Wt 113.2 lb

## 2020-08-26 DIAGNOSIS — M79644 Pain in right finger(s): Secondary | ICD-10-CM

## 2020-08-26 MED ORDER — NAPROXEN 500 MG PO TABS: 500.0000 mg | ORAL_TABLET | Freq: Two times a day (BID) | ORAL | 5 refills | Status: AC

## 2020-08-26 NOTE — Patient Instructions (Signed)
Light duty, wear thumb splint at work.

## 2020-08-26 NOTE — Progress Notes (Signed)
Subjective:    Patient ID: Christina Fernandez, female    DOB: 08/02/1970, 50 y.o.   MRN: [id number]  HPI She has had right thumb pain for about two weeks that is getting worse.  She has no trauma, no locking, no swelling, no redness.  It hurts over the thenar eminence area.  She has tried ice, Advil and heat with no help.  She works in [employer] and has had problems doing her job.   Review of Systems  Constitutional:  Positive for activity change.  Musculoskeletal:  Positive for arthralgias and myalgias.  All other systems reviewed and are negative. For Review of Systems, all other systems reviewed and are negative.  The following is a summary of the past history medically, past history surgically, known current medicines, social history and family history.  This information is gathered electronically by the computer from prior information and documentation.  I review this each visit and have found including this information at this point in the chart is beneficial and informative.   Past Medical History:  Diagnosis Date   Acid reflux    Hypertension    Insomnia    Muscle spasms of lower extremity    Panic attacks     Past Surgical History:  Procedure Laterality Date   ABDOMINAL HYSTERECTOMY     PARTIAL HYSTERECTOMY      Current Outpatient Medications on File Prior to Visit  Medication Sig Dispense Refill   escitalopram (LEXAPRO) 10 MG tablet Take 1 tablet (10 mg total) by mouth daily. 30 tablet 2   hydrochlorothiazide (HYDRODIURIL) 12.5 MG tablet Take 12.5 mg by mouth daily.      omeprazole (PRILOSEC) 20 MG capsule Take 20 mg by mouth 2 (two) times daily before a meal.      traZODone (DESYREL) 50 MG tablet Take 1 tablet (50 mg total) by mouth at bedtime. 30 tablet 2   No current facility-administered medications on file prior to visit.    Social History   Socioeconomic History   Marital status: Single    Spouse name: Not on file   Number of children: 1   Years of  education: Not on file   Highest education level: Not on file  Occupational History   Not on file  Tobacco Use   Smoking status: Former    Packs/day: 0.50    Types: Cigarettes    Quit date: 03/06/2017    Years since quitting: 3.4   Smokeless tobacco: Never  Vaping Use   Vaping Use: Never used  Substance and Sexual Activity   Alcohol use: Not Currently    Comment: occ   Drug use: No   Sexual activity: Yes    Birth control/protection: Surgical  Other Topics Concern   Not on file  Social History Narrative   Not on file   Social Determinants of Health   Financial Resource Strain: Low Risk    Difficulty of Paying Living Expenses: Not hard at all  Food Insecurity: No Food Insecurity   Worried About Programme researcher, broadcasting/film/video in the Last Year: Never true   Ran Out of Food in the Last Year: Never true  Transportation Needs: No Transportation Needs   Lack of Transportation (Medical): No   Lack of Transportation (Non-Medical): No  Physical Activity: Inactive   Days of Exercise per Week: 0 days   Minutes of Exercise per Session: 0 min  Stress: No Stress Concern Present   Feeling of Stress : Not at all  Social Connections: Socially Isolated   Frequency of Communication with Friends and Family: More than three times a week   Frequency of Social Gatherings with Friends and Family: Three times a week   Attends Religious Services: Never   Active Member of Clubs or Organizations: No   Attends Engineer, structural: Never   Marital Status: Never married  Catering manager Violence: Not At Risk   Fear of Current or Ex-Partner: No   Emotionally Abused: No   Physically Abused: No   Sexually Abused: No    Family History  Problem Relation Age of Onset   Atrial fibrillation Mother    Hypertension Mother    Diabetes Mother    Aortic aneurysm Father    Down syndrome Sister    Healthy Brother    Hypertension Maternal Grandfather    Stroke Maternal Grandfather    Healthy Brother     Healthy Brother    Autism Son     BP (!) 169/103   Pulse 100   Ht 5\' 3"  (1.6 m)   Wt 113 lb 3.2 oz (51.3 kg)   BMI 20.05 kg/m   Body mass index is 20.05 kg/m.     Objective:   Physical Exam Vitals and nursing note reviewed. Exam conducted with a chaperone present.  Constitutional:      Appearance: She is well-developed.  HENT:     Head: Normocephalic and atraumatic.  Eyes:     Conjunctiva/sclera: Conjunctivae normal.     Pupils: Pupils are equal, round, and reactive to light.  Cardiovascular:     Rate and Rhythm: Normal rate and regular rhythm.  Pulmonary:     Effort: Pulmonary effort is normal.  Abdominal:     Palpations: Abdomen is soft.  Musculoskeletal:       Hands:     Cervical back: Normal range of motion and neck supple.  Skin:    General: Skin is warm and dry.  Neurological:     Mental Status: She is alert and oriented to person, place, and time.     Cranial Nerves: No cranial nerve deficit.     Motor: No abnormal muscle tone.     Coordination: Coordination normal.     Deep Tendon Reflexes: Reflexes are normal and symmetric. Reflexes normal.  Psychiatric:        Behavior: Behavior normal.        Thought Content: Thought content normal.        Judgment: Judgment normal.  X-rays of the right hand were done and reported separately.        Assessment & Plan:   Encounter Diagnosis  Name Primary?   Thumb pain, right Yes   I will begin Naprosyn 500 po bid pc.  I will give thumb splint.  I have told her about rubs of Aspercreme, Biofreeze or Voltaren Gel.  Note for work given.  Return in three weeks.  Call if any problem.  Precautions discussed.  Electronically Signed , MD 7/26/20223:53 PM

## 2020-08-29 NOTE — Progress Notes (Signed)
[facility] [address], [place] [address]   CLINIC:  Medical Oncology/Hematology  PCP:  Benita Stabile, MD [address] [address] [phone]   REASON FOR VISIT:  Follow-up for leukocytosis  PRIOR THERAPY: None  CURRENT THERAPY: Observation  INTERVAL HISTORY:  Christina Fernandez 50 y.o. female returns for routine follow-up of leukocytosis.  She was last seen by Dr. Ellin Saba on 01/30/2020.  At today's visit, she reports feeling fair.  No recent hospitalizations, surgeries, or changes in baseline health status.  She had COVID-19 in July 2022, but reports that she has recovered completely.  No other infections or steroid use. She denies fever, chills, night sweats, unintentional weight loss.  No current signs or symptoms of blood loss. She continues to take her vitamin D and vitamin B12 supplements. Patient reports that she quit smoking again several months ago.  She has 100% energy and 100% appetite. She endorses that she is maintaining a stable weight.    REVIEW OF SYSTEMS:  Review of Systems  Constitutional:  Negative for appetite change, chills, diaphoresis, fatigue, fever and unexpected weight change.  HENT:   Negative for lump/mass and nosebleeds.   Eyes:  Negative for eye problems.  Respiratory:  Positive for shortness of breath (OCCASIONALLY W/ EXERTION). Negative for cough and hemoptysis.   Cardiovascular:  Negative for chest pain, leg swelling and palpitations.  Gastrointestinal:  Negative for abdominal pain, blood in stool, constipation, diarrhea, nausea and vomiting.  Genitourinary:  Negative for hematuria.   Skin: Negative.   Neurological:  Negative for dizziness, headaches and light-headedness.  Hematological:  Does not bruise/bleed easily.     PAST MEDICAL/SURGICAL HISTORY:  Past Medical History:  Diagnosis Date   Acid reflux    Hypertension    Insomnia    Muscle spasms of lower extremity    Panic attacks    Past  Surgical History:  Procedure Laterality Date   ABDOMINAL HYSTERECTOMY     PARTIAL HYSTERECTOMY       SOCIAL HISTORY:  Social History   Socioeconomic History   Marital status: Single    Spouse name: Not on file   Number of children: 1   Years of education: Not on file   Highest education level: Not on file  Occupational History   Not on file  Tobacco Use   Smoking status: Former    Packs/day: 0.50    Types: Cigarettes    Quit date: 03/06/2017    Years since quitting: 3.4   Smokeless tobacco: Never  Vaping Use   Vaping Use: Never used  Substance and Sexual Activity   Alcohol use: Not Currently    Comment: occ   Drug use: No   Sexual activity: Yes    Birth control/protection: Surgical  Other Topics Concern   Not on file  Social History Narrative   Not on file   Social Determinants of Health   Financial Resource Strain: Low Risk    Difficulty of Paying Living Expenses: Not hard at all  Food Insecurity: No Food Insecurity   Worried About Programme researcher, broadcasting/film/video in the Last Year: Never true   Ran Out of Food in the Last Year: Never true  Transportation Needs: No Transportation Needs   Lack of Transportation (Medical): No   Lack of Transportation (Non-Medical): No  Physical Activity: Inactive   Days of Exercise per Week: 0 days   Minutes of Exercise per Session: 0 min  Stress: No Stress  Concern Present   Feeling of Stress : Not at all  Social Connections: Socially Isolated   Frequency of Communication with Friends and Family: More than three times a week   Frequency of Social Gatherings with Friends and Family: Three times a week   Attends Religious Services: Never   Active Member of Clubs or Organizations: No   Attends Engineer, structural: Never   Marital Status: Never married  Catering manager Violence: Not At Risk   Fear of Current or Ex-Partner: No   Emotionally Abused: No   Physically Abused: No   Sexually Abused: No    FAMILY HISTORY:  Family  History  Problem Relation Age of Onset   Atrial fibrillation Mother    Hypertension Mother    Diabetes Mother    Aortic aneurysm Father    Down syndrome Sister    Healthy Brother    Hypertension Maternal Grandfather    Stroke Maternal Grandfather    Healthy Brother    Healthy Brother    Autism Son     CURRENT MEDICATIONS:  Outpatient Encounter Medications as of 09/01/2020  Medication Sig   escitalopram (LEXAPRO) 10 MG tablet Take 1 tablet (10 mg total) by mouth daily.   hydrochlorothiazide (HYDRODIURIL) 12.5 MG tablet Take 12.5 mg by mouth daily.    naproxen (NAPROSYN) 500 MG tablet Take 1 tablet (500 mg total) by mouth 2 (two) times daily with a meal.   omeprazole (PRILOSEC) 20 MG capsule Take 20 mg by mouth 2 (two) times daily before a meal.    traZODone (DESYREL) 50 MG tablet Take 1 tablet (50 mg total) by mouth at bedtime.   No facility-administered encounter medications on file as of 09/01/2020.    ALLERGIES:  Allergies  Allergen Reactions   Aspirin Hives     PHYSICAL EXAM:  ECOG PERFORMANCE STATUS: 0 - Asymptomatic  There were no vitals filed for this visit. There were no vitals filed for this visit. Physical Exam Constitutional:      Appearance: Normal appearance.  HENT:     Head: Normocephalic and atraumatic.     Mouth/Throat:     Mouth: Mucous membranes are moist.  Eyes:     Extraocular Movements: Extraocular movements intact.     Pupils: Pupils are equal, round, and reactive to light.  Cardiovascular:     Rate and Rhythm: Normal rate and regular rhythm.     Pulses: Normal pulses.     Heart sounds: Normal heart sounds.  Pulmonary:     Effort: Pulmonary effort is normal.     Breath sounds: Wheezing (FAINT) present.  Abdominal:     General: Bowel sounds are normal.     Palpations: Abdomen is soft.     Tenderness: There is no abdominal tenderness.  Musculoskeletal:        General: No swelling.     Right lower leg: No edema.     Left lower leg: No  edema.  Lymphadenopathy:     Cervical: No cervical adenopathy.  Skin:    General: Skin is warm and dry.  Neurological:     General: No focal deficit present.     Mental Status: She is alert and oriented to person, place, and time.  Psychiatric:        Mood and Affect: Mood normal.        Behavior: Behavior normal.     LABORATORY DATA:  I have reviewed the labs as listed.  CBC    Component Value Date/Time  WBC 10.5 08/12/2020 0851   RBC 4.78 08/12/2020 0851   HGB 14.0 08/12/2020 0851   HGB 10.0 (L) 09/08/2011 0537   HCT 42.9 08/12/2020 0851   HCT 29.3 (L) 09/08/2011 0537   PLT 247 08/12/2020 0851   PLT 160 09/08/2011 0537   MCV 89.7 08/12/2020 0851   MCV 89 09/08/2011 0537   MCH 29.3 08/12/2020 0851   MCHC 32.6 08/12/2020 0851   RDW 13.8 08/12/2020 0851   RDW 14.2 09/08/2011 0537   LYMPHSABS 1.2 08/12/2020 0851   LYMPHSABS 1.8 09/08/2011 0537   MONOABS 1.3 (H) 08/12/2020 0851   MONOABS 1.1 (H) 09/08/2011 0537   EOSABS 0.2 08/12/2020 0851   EOSABS 0.6 09/08/2011 0537   BASOSABS 0.1 08/12/2020 0851   BASOSABS 0.1 09/08/2011 0537   CMP Latest Ref Rng & Units 01/23/2020 03/08/2019 11/25/2014  Glucose 70 - 99 mg/dL 93 97 638(V)  BUN 6 - 20 mg/dL 12 7 11   Creatinine 0.44 - 1.00 mg/dL 5.64) 3.32(R  Sodium 135 - 145 mmol/L 135 140 136  Potassium 3.5 - 5.1 mmol/L 3.4(L) 3.2(L) 3.6  Chloride 98 - 111 mmol/L 99 97(L) 104  CO2 22 - 32 mmol/L 27 31 25   Calcium 8.9 - 10.3 mg/dL 9.3 9.6 5.18)  Total Protein 6.5 - 8.1 g/dL 7.2 7.5 7.1  Total Bilirubin 0.3 - 1.2 mg/dL 0.9 0.7 0.4  Alkaline Phos 38 - 126 U/L 78 71 80  AST 15 - 41 U/L 13(L) 18 117(H)  ALT 0 - 44 U/L 16 25 114(H)    DIAGNOSTIC IMAGING:  I have independently reviewed the relevant imaging and discussed with the patient.  ASSESSMENT & PLAN: 1.  Leukocytosis, resolved  - Leukocytosis since 2013, WBC ranging from 13-20 - Negative results for JAK2 V617F, CALR, MPL and BCR/ABL testing - Lupus and RA test  also negative - Patient had quit smoking in 2021, and her leukocytosis had resolved at that time - she started smoking again a few months later, and her leukocytosis returned - At today's visit, she has once again quit smoking, and her WBC has normalized - Most recent labs (08/12/2020): WBC 10.5, normal Hgb and platelets - PLAN: Leukocytosis is reactive secondary to intermittent tobacco use.  No current indication for continued follow-up with hematology.  We will not schedule any follow-up appointments at this time, but patient is welcome to return to 2022 in the future if the need arises.  2.  Vitamin D deficiency -Most recent vitamin D (08/12/2020) normal at 82.89 - PLAN: Continue vitamin D 5000 units daily and follow-up with PCP for ongoing monitoring  2.  Vitamin B12 deficiency - Labs from 08/12/2020 negative for vitamin B12 deficiency  - PLAN: Continue vitamin B12 1 mg tablet daily and follow-up with PCP for ongoing monitoring  PLAN SUMMARY & DISPOSITION: - No follow-up needed at this time, can return to clinic as needed  All questions were answered. The patient knows to call the clinic with any problems, questions or concerns.  Medical decision making: Low  Time spent on visit: I spent 15 minutes counseling the patient face to face. The total time spent in the appointment was 25 minutes and more than 50% was on counseling.   10/13/2020, PA-C  09/01/2020 2:15 PM

## 2020-09-01 ENCOUNTER — Inpatient Hospital Stay (HOSPITAL_COMMUNITY): Payer: Medicare Other | Attending: Hematology | Admitting: Physician Assistant

## 2020-09-01 ENCOUNTER — Other Ambulatory Visit: Payer: Self-pay

## 2020-09-01 VITALS — BP 133/88 | HR 110 | Temp 96.8°F | Resp 18 | Wt 111.7 lb

## 2020-09-01 DIAGNOSIS — E538 Deficiency of other specified B group vitamins: Secondary | ICD-10-CM | POA: Insufficient documentation

## 2020-09-01 DIAGNOSIS — D72829 Elevated white blood cell count, unspecified: Secondary | ICD-10-CM | POA: Diagnosis not present

## 2020-09-01 DIAGNOSIS — E559 Vitamin D deficiency, unspecified: Secondary | ICD-10-CM | POA: Insufficient documentation

## 2020-09-01 DIAGNOSIS — Z79899 Other long term (current) drug therapy: Secondary | ICD-10-CM | POA: Insufficient documentation

## 2020-09-01 NOTE — Patient Instructions (Signed)
[facility] at [facility] Discharge Instructions  You were seen today by Rojelio Brenner PA-C for your elevated white blood cell count.  Your white blood cell count has come back to normal now that you have quit smoking.  This shows that there is no underlying blood abnormality or cancer that is causing your white blood cell to be high.  It was only a reaction from the smoking.  Because your white blood cell count has normalized, there is no need for you to continue to follow-up with the hematology clinic.  We will not schedule you for any follow-up visits at this time, but if you need an appointment with [place] in the future, please call to set one up.  We would be happy to see you again if needed! _____________________________________________________________  Thank you for choosing [facility] at [facility] to provide your oncology and hematology care.  To afford each patient quality time with our provider, please arrive at least 15 minutes before your scheduled appointment time.   If you have a lab appointment with the Cancer Center please come in thru the Main Entrance and check in at the main information desk.  You need to re-schedule your appointment should you arrive 10 or more minutes late.  We strive to give you quality time with our providers, and arriving late affects you and other patients whose appointments are after yours.  Also, if you no show three or more times for appointments you may be dismissed from the clinic at the providers discretion.     Again, thank you for choosing [facility].  Our hope is that these requests will decrease the amount of time that you wait before being seen by our physicians.       _____________________________________________________________  Should you have questions after your visit to [facility], please contact our office at [phone] and follow the prompts.  Our office hours  are 8:00 a.m. and 4:30 p.m. Monday - Friday.  Please note that voicemails left after 4:00 p.m. may not be returned until the following business day.  We are closed weekends and major holidays.  You do have access to a nurse 24-7, just call the main number to the clinic [phone] and do not press any options, hold on the line and a nurse will answer the phone.    For prescription refill requests, have your pharmacy contact our office and allow 72 hours.    Due to Covid, you will need to wear a mask upon entering the hospital. If you do not have a mask, a mask will be given to you at the Main Entrance upon arrival. For doctor visits, patients may have 1 support person age 19 or older with them. For treatment visits, patients can not have anyone with them due to social distancing guidelines and our immunocompromised population.

## 2020-09-03 DIAGNOSIS — H905 Unspecified sensorineural hearing loss: Secondary | ICD-10-CM | POA: Diagnosis not present

## 2020-09-16 ENCOUNTER — Ambulatory Visit: Payer: Medicare Other | Admitting: Orthopaedic Surgery

## 2020-10-07 ENCOUNTER — Ambulatory Visit (INDEPENDENT_AMBULATORY_CARE_PROVIDER_SITE_OTHER): Payer: Medicare Other | Admitting: Orthopaedic Surgery

## 2020-10-07 ENCOUNTER — Encounter: Payer: Self-pay | Admitting: Orthopaedic Surgery

## 2020-10-07 ENCOUNTER — Other Ambulatory Visit: Payer: Self-pay

## 2020-10-07 VITALS — BP 142/97 | HR 80 | Ht 63.0 in | Wt 113.0 lb

## 2020-10-07 DIAGNOSIS — M79644 Pain in right finger(s): Secondary | ICD-10-CM | POA: Diagnosis not present

## 2020-10-07 NOTE — Progress Notes (Signed)
I don't hurt now.  She has full ROM of the right thumb.  No pain, NV intact.  Encounter Diagnosis  Name Primary?   Thumb pain, right Yes   I will see as needed.  Work regular duty.  Call if any problem.  Precautions discussed.  Electronically Signed Darreld Mclean, MD 9/6/20223:58 PM

## 2020-10-07 NOTE — Patient Instructions (Signed)
Note for work stating she is released.

## 2020-10-08 DIAGNOSIS — E041 Nontoxic single thyroid nodule: Secondary | ICD-10-CM | POA: Diagnosis not present

## 2020-10-08 DIAGNOSIS — H6063 Unspecified chronic otitis externa, bilateral: Secondary | ICD-10-CM | POA: Diagnosis not present

## 2020-10-08 DIAGNOSIS — H6123 Impacted cerumen, bilateral: Secondary | ICD-10-CM | POA: Diagnosis not present

## 2020-12-01 DIAGNOSIS — I10 Essential (primary) hypertension: Secondary | ICD-10-CM | POA: Diagnosis not present

## 2020-12-01 DIAGNOSIS — R7301 Impaired fasting glucose: Secondary | ICD-10-CM | POA: Diagnosis not present

## 2021-02-10 ENCOUNTER — Other Ambulatory Visit: Payer: Self-pay | Admitting: Otolaryngology

## 2021-02-10 DIAGNOSIS — E041 Nontoxic single thyroid nodule: Secondary | ICD-10-CM

## 2021-02-10 DIAGNOSIS — H6063 Unspecified chronic otitis externa, bilateral: Secondary | ICD-10-CM | POA: Diagnosis not present

## 2021-02-10 DIAGNOSIS — H6123 Impacted cerumen, bilateral: Secondary | ICD-10-CM | POA: Diagnosis not present

## 2021-02-23 ENCOUNTER — Ambulatory Visit: Payer: No Typology Code available for payment source

## 2021-03-23 ENCOUNTER — Ambulatory Visit: Payer: Medicare Other | Attending: Otolaryngology

## 2021-03-30 ENCOUNTER — Other Ambulatory Visit: Payer: Medicare Other

## 2021-03-30 ENCOUNTER — Other Ambulatory Visit: Payer: Self-pay

## 2021-03-30 ENCOUNTER — Ambulatory Visit
Admission: RE | Admit: 2021-03-30 | Discharge: 2021-03-30 | Disposition: A | Discharge status: Home / Self Care | Payer: Medicare Other | Source: Ambulatory Visit | Attending: Otolaryngology | Admitting: Otolaryngology

## 2021-03-30 DIAGNOSIS — E041 Nontoxic single thyroid nodule: Secondary | ICD-10-CM | POA: Insufficient documentation

## 2021-04-08 ENCOUNTER — Other Ambulatory Visit: Payer: Self-pay | Admitting: Otolaryngology

## 2021-04-08 DIAGNOSIS — E041 Nontoxic single thyroid nodule: Secondary | ICD-10-CM

## 2021-04-21 ENCOUNTER — Ambulatory Visit
Admission: RE | Admit: 2021-04-21 | Discharge: 2021-04-21 | Disposition: A | Discharge status: Home / Self Care | Payer: Medicare Other | Source: Ambulatory Visit | Attending: Otolaryngology | Admitting: Otolaryngology

## 2021-04-21 DIAGNOSIS — E041 Nontoxic single thyroid nodule: Secondary | ICD-10-CM | POA: Insufficient documentation

## 2021-04-21 NOTE — Procedures (Signed)
Successful US guided FNA of left mid thyroid nodule ?No complications. ?See PACS for full report. ? ?Pattricia Boss D, PA-C ?04/21/2021, 1:51 PM ? ? ? ? ?

## 2021-04-27 LAB — CYTOLOGY - NON PAP
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
## 2021-06-16 DIAGNOSIS — H6693 Otitis media, unspecified, bilateral: Secondary | ICD-10-CM | POA: Diagnosis not present

## 2021-06-16 DIAGNOSIS — H9201 Otalgia, right ear: Secondary | ICD-10-CM | POA: Diagnosis not present

## 2021-06-16 DIAGNOSIS — I10 Essential (primary) hypertension: Secondary | ICD-10-CM | POA: Diagnosis not present

## 2021-06-22 DIAGNOSIS — G4700 Insomnia, unspecified: Secondary | ICD-10-CM | POA: Diagnosis not present

## 2021-06-22 DIAGNOSIS — E782 Mixed hyperlipidemia: Secondary | ICD-10-CM | POA: Diagnosis not present

## 2021-06-22 DIAGNOSIS — Z634 Disappearance and death of family member: Secondary | ICD-10-CM | POA: Diagnosis not present

## 2021-06-22 DIAGNOSIS — K219 Gastro-esophageal reflux disease without esophagitis: Secondary | ICD-10-CM | POA: Diagnosis not present

## 2021-06-22 DIAGNOSIS — D72829 Elevated white blood cell count, unspecified: Secondary | ICD-10-CM | POA: Diagnosis not present

## 2021-06-22 DIAGNOSIS — I10 Essential (primary) hypertension: Secondary | ICD-10-CM | POA: Diagnosis not present

## 2021-06-22 DIAGNOSIS — E876 Hypokalemia: Secondary | ICD-10-CM | POA: Diagnosis not present

## 2021-06-22 DIAGNOSIS — H6693 Otitis media, unspecified, bilateral: Secondary | ICD-10-CM | POA: Diagnosis not present

## 2021-06-22 DIAGNOSIS — F17200 Nicotine dependence, unspecified, uncomplicated: Secondary | ICD-10-CM | POA: Diagnosis not present

## 2021-06-22 DIAGNOSIS — Z0001 Encounter for general adult medical examination with abnormal findings: Secondary | ICD-10-CM | POA: Diagnosis not present

## 2021-06-22 DIAGNOSIS — R7301 Impaired fasting glucose: Secondary | ICD-10-CM | POA: Diagnosis not present

## 2021-06-23 DIAGNOSIS — H60331 Swimmer's ear, right ear: Secondary | ICD-10-CM | POA: Diagnosis not present

## 2021-06-25 DIAGNOSIS — H60333 Swimmer's ear, bilateral: Secondary | ICD-10-CM | POA: Diagnosis not present

## 2021-06-25 DIAGNOSIS — H903 Sensorineural hearing loss, bilateral: Secondary | ICD-10-CM | POA: Diagnosis not present

## 2021-07-02 DIAGNOSIS — H6063 Unspecified chronic otitis externa, bilateral: Secondary | ICD-10-CM | POA: Diagnosis not present

## 2021-07-02 DIAGNOSIS — H903 Sensorineural hearing loss, bilateral: Secondary | ICD-10-CM | POA: Diagnosis not present

## 2021-07-22 DIAGNOSIS — H905 Unspecified sensorineural hearing loss: Secondary | ICD-10-CM | POA: Diagnosis not present

## 2021-08-25 ENCOUNTER — Ambulatory Visit (INDEPENDENT_AMBULATORY_CARE_PROVIDER_SITE_OTHER): Payer: Medicare Other | Admitting: Orthopaedic Surgery

## 2021-08-25 ENCOUNTER — Encounter: Payer: Self-pay | Admitting: Orthopaedic Surgery

## 2021-08-25 ENCOUNTER — Ambulatory Visit (INDEPENDENT_AMBULATORY_CARE_PROVIDER_SITE_OTHER): Payer: Medicare Other

## 2021-08-25 VITALS — BP 166/86 | HR 98 | Ht 63.0 in | Wt 115.0 lb

## 2021-08-25 DIAGNOSIS — M25571 Pain in right ankle and joints of right foot: Secondary | ICD-10-CM

## 2021-08-25 DIAGNOSIS — S96911A Strain of unspecified muscle and tendon at ankle and foot level, right foot, initial encounter: Secondary | ICD-10-CM | POA: Diagnosis not present

## 2021-08-25 NOTE — Progress Notes (Signed)
My ankle hurts.  She has had right lateral ankle pain for the last three weeks. She has swelling.  She has no trauma, no redness, no insect bites.  She has used a small ankle brace that helps only a little.  She has tried ice and elevation. She is on Naprosyn and I told her to continue this.  Right ankle has lateral swelling and tenderness over the anterior talofibular ligament. ROM is full, limp to the right, NV intact.  X-rays were done of the right ankle, reported separately.  Encounter Diagnoses  Name Primary?   Pain in right ankle and joints of right foot Yes   Strain of right ankle, initial encounter    I will give a better ankle brace.  Instructions for Contrast Baths given.  Return in two weeks.  Call if any problem.  Precautions discussed.  Electronically Signed Darreld Mclean, MD 7/25/20233:59 PM

## 2021-09-03 DIAGNOSIS — H6063 Unspecified chronic otitis externa, bilateral: Secondary | ICD-10-CM | POA: Diagnosis not present

## 2021-09-03 DIAGNOSIS — E041 Nontoxic single thyroid nodule: Secondary | ICD-10-CM | POA: Diagnosis not present

## 2021-09-03 DIAGNOSIS — H903 Sensorineural hearing loss, bilateral: Secondary | ICD-10-CM | POA: Diagnosis not present

## 2021-09-15 ENCOUNTER — Encounter: Payer: Self-pay | Admitting: Orthopaedic Surgery

## 2021-09-15 ENCOUNTER — Ambulatory Visit (INDEPENDENT_AMBULATORY_CARE_PROVIDER_SITE_OTHER): Payer: Medicare Other | Admitting: Orthopaedic Surgery

## 2021-09-15 VITALS — BP 141/88 | HR 80 | Ht 63.0 in | Wt 115.0 lb

## 2021-09-15 DIAGNOSIS — M25571 Pain in right ankle and joints of right foot: Secondary | ICD-10-CM

## 2021-09-15 NOTE — Progress Notes (Signed)
My brace is bothering me.  She has had problems with the ankle brace she wears.  She has to stand most all of her work shift.  She says the brace has helped the pain and the swelling but it bothers her by the end of her shift. She has no new trauma.  I will give her an Rx for a brace from [place].  The right ankle has no swelling today, no redness, near full ROM, slight limp right, NV intact.  Encounter Diagnosis  Name Primary?   Pain in right ankle and joints of right foot Yes   Return in two weeks.  Try to find another brace.  Call if any problem.  Precautions discussed.  Electronically Signed Darreld Mclean, MD 8/15/20231:50 PM

## 2021-09-29 ENCOUNTER — Ambulatory Visit (INDEPENDENT_AMBULATORY_CARE_PROVIDER_SITE_OTHER): Payer: Medicare Other | Admitting: Orthopaedic Surgery

## 2021-09-29 ENCOUNTER — Encounter: Payer: Self-pay | Admitting: Orthopaedic Surgery

## 2021-09-29 VITALS — Ht 63.0 in | Wt 108.4 lb

## 2021-09-29 DIAGNOSIS — M25571 Pain in right ankle and joints of right foot: Secondary | ICD-10-CM

## 2021-09-29 NOTE — Progress Notes (Signed)
I am much better  Her right ankle is improved. She is using a brace.  She has no swelling and full ROM.  NV intact. Gait is normal.  Encounter Diagnosis  Name Primary?   Pain in right ankle and joints of right foot Yes   I will see as needed.  Call if any problem.  Precautions discussed.  Electronically Signed Darreld Mclean, MD 8/29/20233:31 PM

## 2021-12-15 ENCOUNTER — Other Ambulatory Visit: Payer: Self-pay | Admitting: Otolaryngology

## 2021-12-15 DIAGNOSIS — H6123 Impacted cerumen, bilateral: Secondary | ICD-10-CM | POA: Diagnosis not present

## 2021-12-15 DIAGNOSIS — E041 Nontoxic single thyroid nodule: Secondary | ICD-10-CM

## 2021-12-15 DIAGNOSIS — H903 Sensorineural hearing loss, bilateral: Secondary | ICD-10-CM | POA: Diagnosis not present

## 2021-12-15 DIAGNOSIS — H6063 Unspecified chronic otitis externa, bilateral: Secondary | ICD-10-CM | POA: Diagnosis not present

## 2021-12-21 ENCOUNTER — Inpatient Hospital Stay: Admission: RE | Admit: 2021-12-21 | Payer: Medicare Other | Source: Ambulatory Visit

## 2021-12-21 DIAGNOSIS — R7301 Impaired fasting glucose: Secondary | ICD-10-CM | POA: Diagnosis not present

## 2021-12-21 DIAGNOSIS — E782 Mixed hyperlipidemia: Secondary | ICD-10-CM | POA: Diagnosis not present

## 2021-12-28 DIAGNOSIS — I10 Essential (primary) hypertension: Secondary | ICD-10-CM | POA: Diagnosis not present

## 2021-12-28 DIAGNOSIS — G4700 Insomnia, unspecified: Secondary | ICD-10-CM | POA: Diagnosis not present

## 2021-12-28 DIAGNOSIS — R7301 Impaired fasting glucose: Secondary | ICD-10-CM | POA: Diagnosis not present

## 2021-12-28 DIAGNOSIS — H60509 Unspecified acute noninfective otitis externa, unspecified ear: Secondary | ICD-10-CM | POA: Diagnosis not present

## 2021-12-28 DIAGNOSIS — D72829 Elevated white blood cell count, unspecified: Secondary | ICD-10-CM | POA: Diagnosis not present

## 2021-12-28 DIAGNOSIS — H6693 Otitis media, unspecified, bilateral: Secondary | ICD-10-CM | POA: Diagnosis not present

## 2021-12-28 DIAGNOSIS — Z634 Disappearance and death of family member: Secondary | ICD-10-CM | POA: Diagnosis not present

## 2021-12-28 DIAGNOSIS — K219 Gastro-esophageal reflux disease without esophagitis: Secondary | ICD-10-CM | POA: Diagnosis not present

## 2021-12-28 DIAGNOSIS — E782 Mixed hyperlipidemia: Secondary | ICD-10-CM | POA: Diagnosis not present

## 2021-12-28 DIAGNOSIS — F17200 Nicotine dependence, unspecified, uncomplicated: Secondary | ICD-10-CM | POA: Diagnosis not present

## 2021-12-28 DIAGNOSIS — E876 Hypokalemia: Secondary | ICD-10-CM | POA: Diagnosis not present

## 2022-01-04 ENCOUNTER — Ambulatory Visit
Admission: RE | Admit: 2022-01-04 | Discharge: 2022-01-04 | Disposition: A | Discharge status: Home / Self Care | Payer: Medicare Other | Source: Ambulatory Visit | Attending: Otolaryngology | Admitting: Otolaryngology

## 2022-01-04 ENCOUNTER — Other Ambulatory Visit: Payer: Medicare Other

## 2022-01-04 DIAGNOSIS — E041 Nontoxic single thyroid nodule: Secondary | ICD-10-CM

## 2022-01-27 DIAGNOSIS — H5213 Myopia, bilateral: Secondary | ICD-10-CM | POA: Diagnosis not present

## 2022-01-27 DIAGNOSIS — H2513 Age-related nuclear cataract, bilateral: Secondary | ICD-10-CM | POA: Diagnosis not present

## 2022-02-22 DIAGNOSIS — H5213 Myopia, bilateral: Secondary | ICD-10-CM | POA: Diagnosis not present

## 2022-03-23 DIAGNOSIS — E041 Nontoxic single thyroid nodule: Secondary | ICD-10-CM | POA: Diagnosis not present

## 2022-03-23 DIAGNOSIS — H903 Sensorineural hearing loss, bilateral: Secondary | ICD-10-CM | POA: Diagnosis not present

## 2022-03-23 DIAGNOSIS — H6063 Unspecified chronic otitis externa, bilateral: Secondary | ICD-10-CM | POA: Diagnosis not present

## 2022-03-29 ENCOUNTER — Other Ambulatory Visit: Payer: Medicare Other

## 2022-04-01 ENCOUNTER — Encounter: Payer: Self-pay | Admitting: Radiology

## 2022-06-22 DIAGNOSIS — H9213 Otorrhea, bilateral: Secondary | ICD-10-CM | POA: Diagnosis not present

## 2022-06-22 DIAGNOSIS — E041 Nontoxic single thyroid nodule: Secondary | ICD-10-CM | POA: Diagnosis not present

## 2022-06-22 DIAGNOSIS — H6063 Unspecified chronic otitis externa, bilateral: Secondary | ICD-10-CM | POA: Diagnosis not present

## 2022-06-22 DIAGNOSIS — H903 Sensorineural hearing loss, bilateral: Secondary | ICD-10-CM | POA: Diagnosis not present

## 2022-06-23 DIAGNOSIS — R7301 Impaired fasting glucose: Secondary | ICD-10-CM | POA: Diagnosis not present

## 2022-06-23 DIAGNOSIS — E079 Disorder of thyroid, unspecified: Secondary | ICD-10-CM | POA: Diagnosis not present

## 2022-06-23 DIAGNOSIS — E782 Mixed hyperlipidemia: Secondary | ICD-10-CM | POA: Diagnosis not present

## 2022-06-30 DIAGNOSIS — R7303 Prediabetes: Secondary | ICD-10-CM | POA: Diagnosis not present

## 2022-06-30 DIAGNOSIS — F17200 Nicotine dependence, unspecified, uncomplicated: Secondary | ICD-10-CM | POA: Diagnosis not present

## 2022-06-30 DIAGNOSIS — K219 Gastro-esophageal reflux disease without esophagitis: Secondary | ICD-10-CM | POA: Diagnosis not present

## 2022-06-30 DIAGNOSIS — D72829 Elevated white blood cell count, unspecified: Secondary | ICD-10-CM | POA: Diagnosis not present

## 2022-06-30 DIAGNOSIS — Z0000 Encounter for general adult medical examination without abnormal findings: Secondary | ICD-10-CM | POA: Diagnosis not present

## 2022-06-30 DIAGNOSIS — I10 Essential (primary) hypertension: Secondary | ICD-10-CM | POA: Diagnosis not present

## 2022-06-30 DIAGNOSIS — E782 Mixed hyperlipidemia: Secondary | ICD-10-CM | POA: Diagnosis not present

## 2022-06-30 DIAGNOSIS — E876 Hypokalemia: Secondary | ICD-10-CM | POA: Diagnosis not present

## 2022-06-30 DIAGNOSIS — E079 Disorder of thyroid, unspecified: Secondary | ICD-10-CM | POA: Diagnosis not present

## 2022-06-30 DIAGNOSIS — I700 Atherosclerosis of aorta: Secondary | ICD-10-CM | POA: Diagnosis not present

## 2022-06-30 DIAGNOSIS — G4700 Insomnia, unspecified: Secondary | ICD-10-CM | POA: Diagnosis not present

## 2022-07-06 DIAGNOSIS — E041 Nontoxic single thyroid nodule: Secondary | ICD-10-CM | POA: Diagnosis not present

## 2022-07-06 DIAGNOSIS — H903 Sensorineural hearing loss, bilateral: Secondary | ICD-10-CM | POA: Diagnosis not present

## 2022-07-06 DIAGNOSIS — H6063 Unspecified chronic otitis externa, bilateral: Secondary | ICD-10-CM | POA: Diagnosis not present

## 2022-10-06 DIAGNOSIS — J340 Abscess, furuncle and carbuncle of nose: Secondary | ICD-10-CM | POA: Diagnosis not present

## 2022-10-06 DIAGNOSIS — H6063 Unspecified chronic otitis externa, bilateral: Secondary | ICD-10-CM | POA: Diagnosis not present

## 2022-10-19 ENCOUNTER — Ambulatory Visit: Payer: Medicare Other | Admitting: Orthopaedic Surgery

## 2022-12-23 DIAGNOSIS — R7303 Prediabetes: Secondary | ICD-10-CM | POA: Diagnosis not present

## 2022-12-23 DIAGNOSIS — E079 Disorder of thyroid, unspecified: Secondary | ICD-10-CM | POA: Diagnosis not present

## 2022-12-23 DIAGNOSIS — E782 Mixed hyperlipidemia: Secondary | ICD-10-CM | POA: Diagnosis not present

## 2022-12-28 DIAGNOSIS — E782 Mixed hyperlipidemia: Secondary | ICD-10-CM | POA: Diagnosis not present

## 2022-12-28 DIAGNOSIS — R7303 Prediabetes: Secondary | ICD-10-CM | POA: Diagnosis not present

## 2022-12-28 DIAGNOSIS — K219 Gastro-esophageal reflux disease without esophagitis: Secondary | ICD-10-CM | POA: Diagnosis not present

## 2022-12-28 DIAGNOSIS — D72829 Elevated white blood cell count, unspecified: Secondary | ICD-10-CM | POA: Diagnosis not present

## 2022-12-28 DIAGNOSIS — F17200 Nicotine dependence, unspecified, uncomplicated: Secondary | ICD-10-CM | POA: Diagnosis not present

## 2022-12-28 DIAGNOSIS — E079 Disorder of thyroid, unspecified: Secondary | ICD-10-CM | POA: Diagnosis not present

## 2022-12-28 DIAGNOSIS — I10 Essential (primary) hypertension: Secondary | ICD-10-CM | POA: Diagnosis not present

## 2022-12-28 DIAGNOSIS — E876 Hypokalemia: Secondary | ICD-10-CM | POA: Diagnosis not present

## 2022-12-28 DIAGNOSIS — I700 Atherosclerosis of aorta: Secondary | ICD-10-CM | POA: Diagnosis not present

## 2022-12-28 DIAGNOSIS — Z79899 Other long term (current) drug therapy: Secondary | ICD-10-CM | POA: Diagnosis not present

## 2023-02-08 DIAGNOSIS — H6063 Unspecified chronic otitis externa, bilateral: Secondary | ICD-10-CM | POA: Diagnosis not present

## 2023-02-08 DIAGNOSIS — E041 Nontoxic single thyroid nodule: Secondary | ICD-10-CM | POA: Diagnosis not present

## 2023-06-29 DIAGNOSIS — E782 Mixed hyperlipidemia: Secondary | ICD-10-CM | POA: Diagnosis not present

## 2023-06-29 DIAGNOSIS — R7303 Prediabetes: Secondary | ICD-10-CM | POA: Diagnosis not present

## 2023-07-11 DIAGNOSIS — J302 Other seasonal allergic rhinitis: Secondary | ICD-10-CM | POA: Diagnosis not present

## 2023-07-11 DIAGNOSIS — E079 Disorder of thyroid, unspecified: Secondary | ICD-10-CM | POA: Diagnosis not present

## 2023-07-11 DIAGNOSIS — R7303 Prediabetes: Secondary | ICD-10-CM | POA: Diagnosis not present

## 2023-07-11 DIAGNOSIS — K219 Gastro-esophageal reflux disease without esophagitis: Secondary | ICD-10-CM | POA: Diagnosis not present

## 2023-07-11 DIAGNOSIS — E876 Hypokalemia: Secondary | ICD-10-CM | POA: Diagnosis not present

## 2023-07-11 DIAGNOSIS — D72829 Elevated white blood cell count, unspecified: Secondary | ICD-10-CM | POA: Diagnosis not present

## 2023-07-11 DIAGNOSIS — I700 Atherosclerosis of aorta: Secondary | ICD-10-CM | POA: Diagnosis not present

## 2023-07-11 DIAGNOSIS — R7401 Elevation of levels of liver transaminase levels: Secondary | ICD-10-CM | POA: Diagnosis not present

## 2023-07-11 DIAGNOSIS — G4700 Insomnia, unspecified: Secondary | ICD-10-CM | POA: Diagnosis not present

## 2023-07-11 DIAGNOSIS — I10 Essential (primary) hypertension: Secondary | ICD-10-CM | POA: Diagnosis not present

## 2023-07-11 DIAGNOSIS — E782 Mixed hyperlipidemia: Secondary | ICD-10-CM | POA: Diagnosis not present

## 2023-07-26 ENCOUNTER — Ambulatory Visit
Admission: EM | Admit: 2023-07-26 | Discharge: 2023-07-26 | Disposition: A | Discharge status: Home / Self Care | Attending: Emergency Medicine | Admitting: Emergency Medicine

## 2023-07-26 DIAGNOSIS — S91209A Unspecified open wound of unspecified toe(s) with damage to nail, initial encounter: Secondary | ICD-10-CM | POA: Diagnosis not present

## 2023-07-26 MED ORDER — CEPHALEXIN 500 MG PO CAPS: 500.0000 mg | ORAL_CAPSULE | Freq: Three times a day (TID) | ORAL | 0 refills | Status: DC

## 2023-07-26 MED ORDER — CEPHALEXIN 500 MG PO CAPS: 500.0000 mg | ORAL_CAPSULE | Freq: Three times a day (TID) | ORAL | 0 refills | Status: AC

## 2023-07-26 NOTE — ED Provider Notes (Signed)
 [facility]    CSN: [id number] Arrival date & time: 07/26/23  1130      History   Chief Complaint Chief Complaint  Patient presents with   Nail Problem    HPI Christina Fernandez is a 53 y.o. female.   HPI  53 year old female with past medical history significant for panic attacks, muscle spasms in her lower extremity, insomnia, hypertension, and GERD presents for evaluation of left great toenail avulsion.   Past Medical History:  Diagnosis Date   Acid reflux    Hypertension    Insomnia    Muscle spasms of lower extremity    Panic attacks     Patient Active Problem List   Diagnosis Date Noted   Leukocytosis 03/08/2019    Past Surgical History:  Procedure Laterality Date   ABDOMINAL HYSTERECTOMY     PARTIAL HYSTERECTOMY      OB History   No obstetric history on file.      Home Medications    Prior to Admission medications   Medication Sig Start Date End Date Taking? Authorizing Provider  cephALEXin (KEFLEX) 500 MG capsule Take 1 capsule (500 mg total) by mouth 3 (three) times daily for 7 days. 07/26/23 08/02/23 Yes Bernardino Ditch, NP  hydrochlorothiazide (HYDRODIURIL) 12.5 MG tablet Take 12.5 mg by mouth daily.  09/07/18  Yes [provider]  omeprazole (PRILOSEC) 20 MG capsule Take 20 mg by mouth 2 (two) times daily before a meal.  09/07/18  Yes [provider]  potassium chloride SA (KLOR-CON M) 20 MEQ tablet Take 20 mEq by mouth daily. 06/22/21  Yes [provider]  escitalopram  (LEXAPRO ) 10 MG tablet Take 1 tablet (10 mg total) by mouth daily. 09/07/16 08/26/20  Okey Barnie SAUNDERS, MD  naproxen  (NAPROSYN ) 500 MG tablet Take 1 tablet (500 mg total) by mouth 2 (two) times daily with a meal. 08/26/20   Brenna Lin, MD  traZODone  (DESYREL ) 50 MG tablet Take 1 tablet (50 mg total) by mouth at bedtime. 09/07/16   Okey Barnie SAUNDERS, MD    Family History Family History  Problem Relation Age of Onset   Atrial fibrillation Mother     Hypertension Mother    Diabetes Mother    Aortic aneurysm Father    Down syndrome Sister    Healthy Brother    Hypertension Maternal Grandfather    Stroke Maternal Grandfather    Healthy Brother    Healthy Brother    Autism Son     Social History Social History   Tobacco Use   Smoking status: Former    Current packs/day: 0.00    Types: Cigarettes    Quit date: 03/06/2017    Years since quitting: 6.3   Smokeless tobacco: Never  Vaping Use   Vaping status: Never Used  Substance Use Topics   Alcohol use: Not Currently    Comment: occ   Drug use: No     Allergies   Aspirin   Review of Systems Review of Systems  Skin:        Left big toenail avulsion     Physical Exam Triage Vital Signs ED Triage Vitals  Encounter Vitals Group     BP 07/26/23 1143 (!) 147/98     Girls Systolic BP Percentile --      Girls Diastolic BP Percentile --      Boys Systolic BP Percentile --      Boys Diastolic BP Percentile --      Pulse Rate  07/26/23 1143 81     Resp 07/26/23 1143 19     Temp 07/26/23 1143 98.5 F (36.9 C)     Temp Source 07/26/23 1143 Oral     SpO2 07/26/23 1143 96 %     Weight --      Height --      Head Circumference --      Peak Flow --      Pain Score 07/26/23 1142 10     Pain Loc --      Pain Education --      Exclude from Growth Chart --    No data found.  Updated Vital Signs BP (!) 147/98 (BP Location: Right Arm)   Pulse 81   Temp 98.5 F (36.9 C) (Oral)   Resp 19   SpO2 96%   Visual Acuity Right Eye Distance:   Left Eye Distance:   Bilateral Distance:    Right Eye Near:   Left Eye Near:    Bilateral Near:     Physical Exam Vitals and nursing note reviewed.  Constitutional:      Appearance: Normal appearance.  HENT:     Head: Normocephalic and atraumatic.   Skin:    General: Skin is warm and dry.     Capillary Refill: Capillary refill takes less than 2 seconds.     Findings: No erythema.   Neurological:     General: No focal  deficit present.     Mental Status: She is alert and oriented to person, place, and time.      UC Treatments / Results  Labs (all labs ordered are listed, but only abnormal results are displayed) Labs Reviewed - No data to display  EKG   Radiology No results found.  Procedures Procedures (including critical care time)  Medications Ordered in UC Medications - No data to display  Initial Impression / Assessment and Plan / UC Course  I have reviewed the triage vital signs and the nursing notes.  Pertinent labs & imaging results that were available during my care of the patient were reviewed by me and considered in my medical decision making (see chart for details).   Patient is a nontoxic-appearing 53 year old female presenting for evaluation of an avulsion to her left big toenail.  She reports that a number of years ago she had an ingrown toenail removed at [facility] on the same toe.  When the toenail grew back in it came in thick and yellow.  Last night while she was in the shower she was washing her foot and the toenail partially came off.  There is no active bleeding.  As you can see manage above, the nail itself is hanging on by a small piece of skin.  There is some bloody drainage present, but I think it is more from the toenail flopping around then any trauma.  The patient also denies trauma.  I will inject a small wheal of 1% lidocaine without epi directly at the site where it is attached and removed the remainder of the toenail.  The toe was cleansed with chlorhexidine and anesthetized with 0.3 mL of 1% lidocaine without epi.  Once good anesthesia was achieved the remainder of the toenail was easily removed with minimal force.  There is no active bleeding from the nailbed.  I cleansed the nailbed with flexion and saline and applied a Band-Aid to protect the nailbed in the absence of a toenail.  There are some mild erythema around the cuticle  so I will discharge the patient  home on a 7-day course of Keflex 500 mg 3 times daily to prevent infection.  I have advised her to keep the nailbed covered when she is wearing shoes to protect it from injury until new nail grows out.  She should also soak her foot in warm water Epsom salts 2-3 times a day to help facilitate drainage of any developing infection.   Final Clinical Impressions(s) / UC Diagnoses   Final diagnoses:  Avulsion of toenail of left foot     Discharge Instructions      Keep your nailbed covered with a Band-Aid when you are wearing shoes to protect it from injury.  You may leave the area open to air when you are not wearing shoes.  Soak your foot in warm water and Epsom salts 2-3 times a day to help facilitate drainage of any developing infection given the redness to your toe.  Take the Keflex 500 mg 3 times a day with food for the next 7 days to prevent infection.  If you have any increased redness, swelling, pus drainage from your toe, red streaks going up your toe or foot, or fever you need to return for reevaluation or see your primary care provider.     ED Prescriptions     Medication Sig Dispense Auth. Provider   cephALEXin (KEFLEX) 500 MG capsule Take 1 capsule (500 mg total) by mouth 3 (three) times daily for 7 days. 21 capsule Bernardino Ditch, NP      PDMP not reviewed this encounter.   Bernardino Ditch, NP 07/26/23 1204

## 2023-07-26 NOTE — ED Triage Notes (Signed)
 Toe nail on big toe on left toe is lifting up.

## 2023-07-26 NOTE — Discharge Instructions (Addendum)
 Keep your nailbed covered with a Band-Aid when you are wearing shoes to protect it from injury.  You may leave the area open to air when you are not wearing shoes.  Soak your foot in warm water and Epsom salts 2-3 times a day to help facilitate drainage of any developing infection given the redness to your toe.  Take the Keflex 500 mg 3 times a day with food for the next 7 days to prevent infection.  If you have any increased redness, swelling, pus drainage from your toe, red streaks going up your toe or foot, or fever you need to return for reevaluation or see your primary care provider.

## 2023-09-23 ENCOUNTER — Encounter: Payer: Self-pay | Admitting: Radiology

## 2023-11-16 IMAGING — US US THYROID
1 series · 13 of 25 positions shown · non-contrast
Comparison: November 2010 (report only), December 2010 biopsy
(report only)

CLINICAL DATA: Prior ultrasound follow-up.

EXAM:
THYROID ULTRASOUND
TECHNIQUE: Ultrasound examination of the thyroid gland and adjacent soft
tissues was performed.

[Series 1: us thyroid · 0.08mm/px · 13 of 95 slices shown]
[im 1/95]
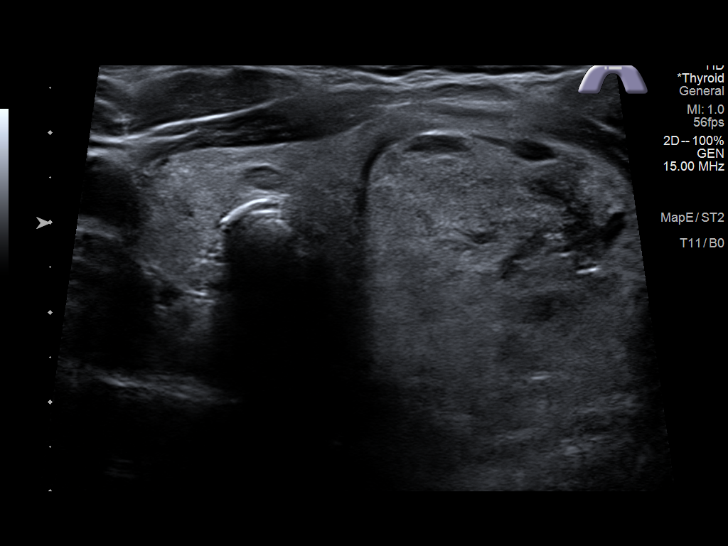
[im 8/95]
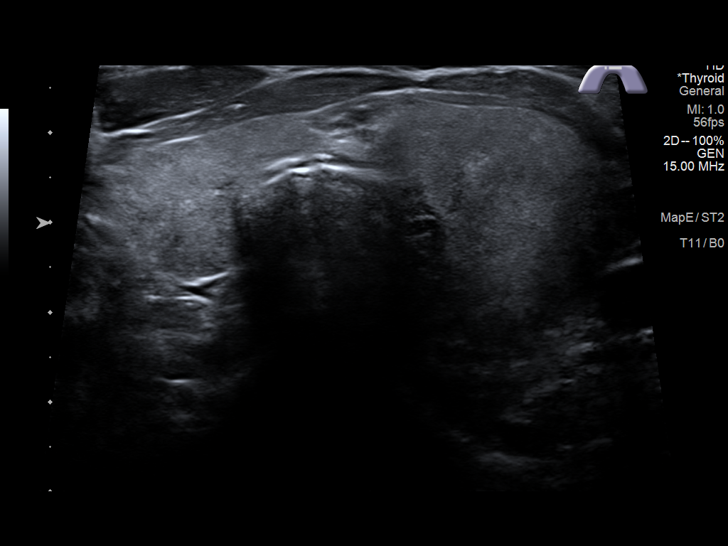
[im 16/95]
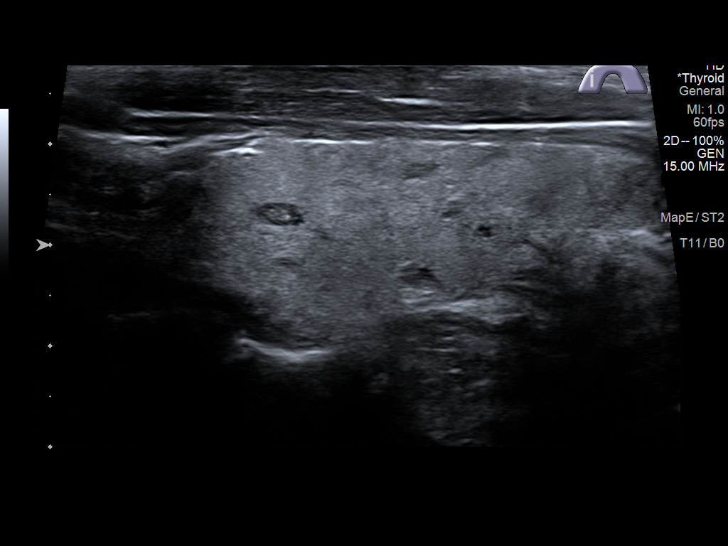
[im 24/95]
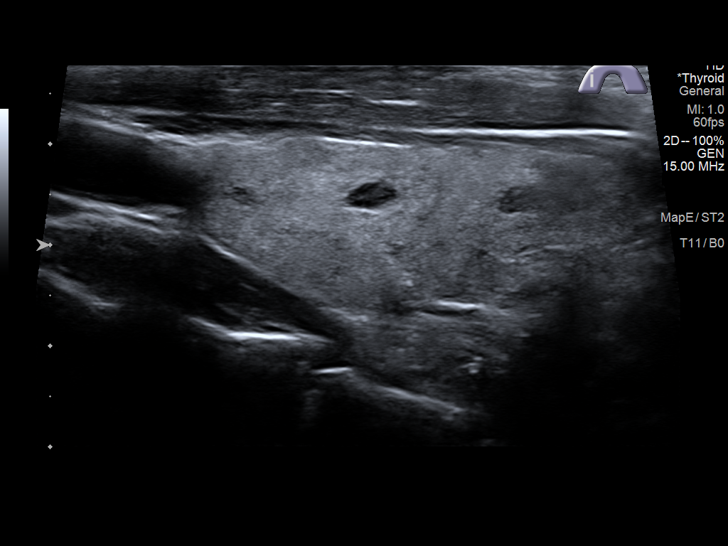
[im 32/95]
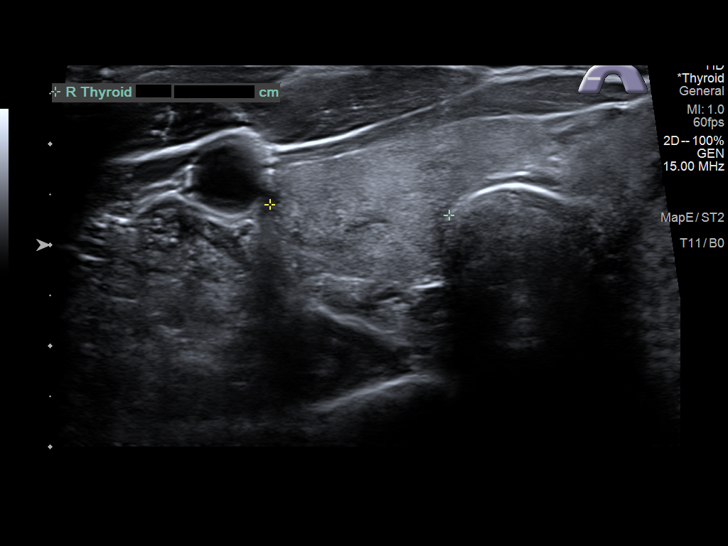
[im 40/95]
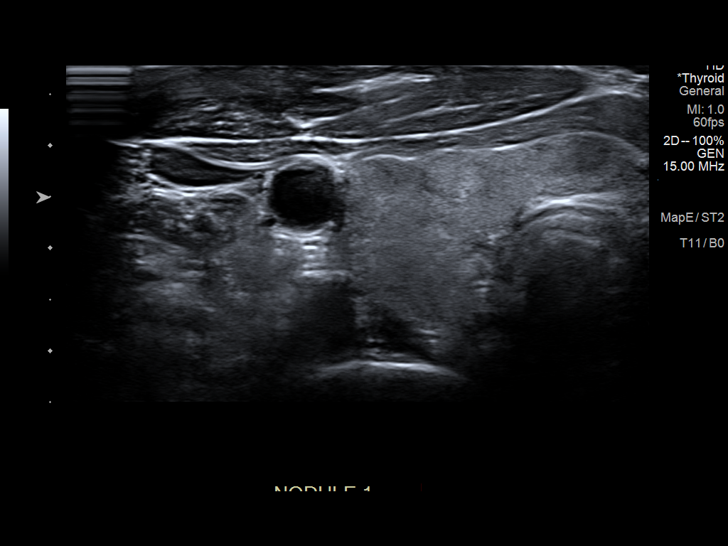
[im 48/95]
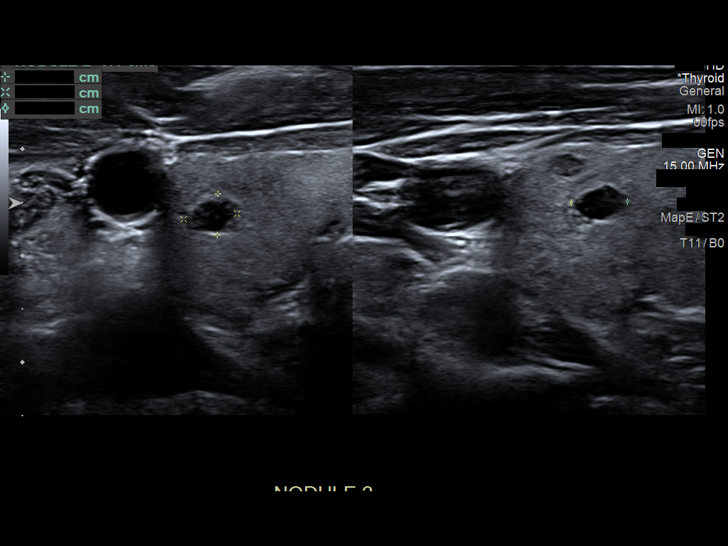
[im 55/95]
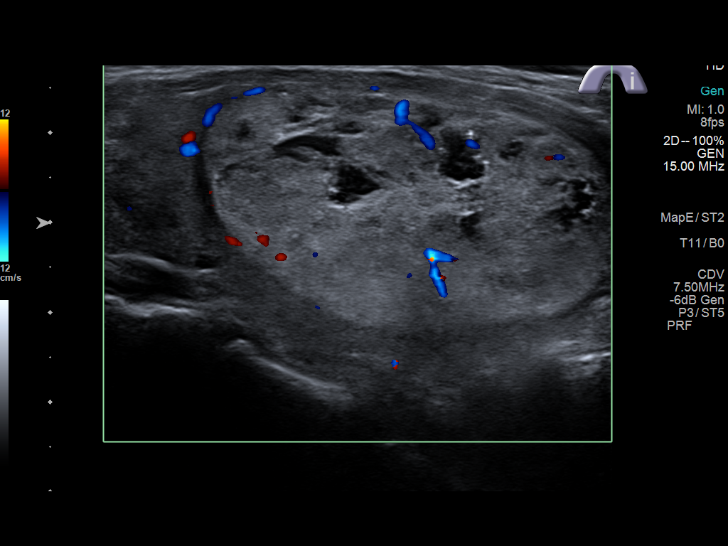
[im 63/95]
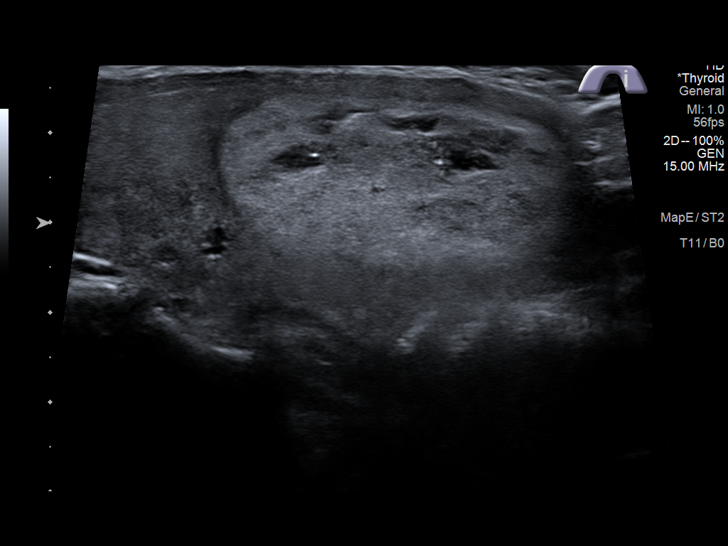
[im 71/95]
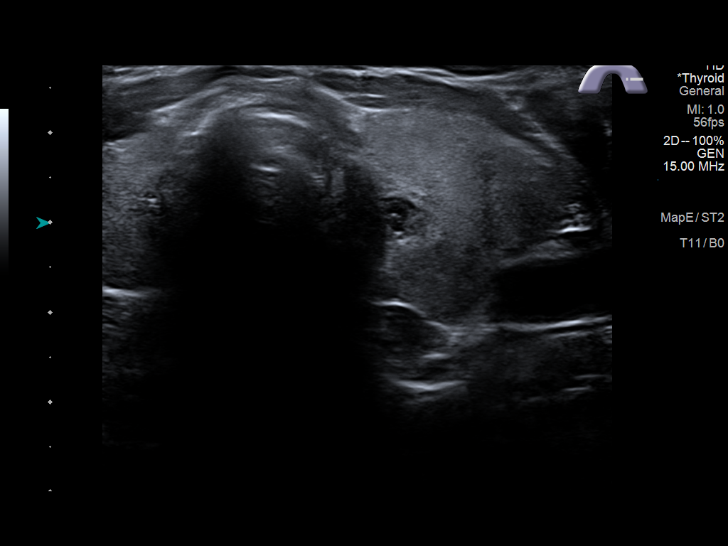
[im 79/95]
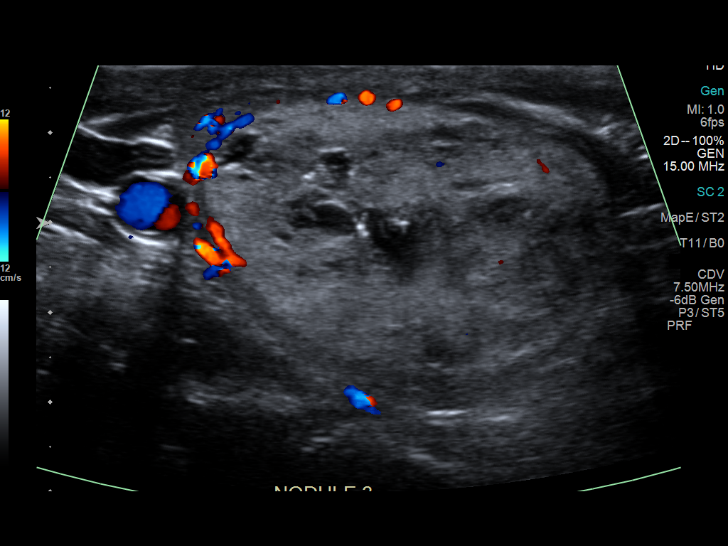
[im 87/95]
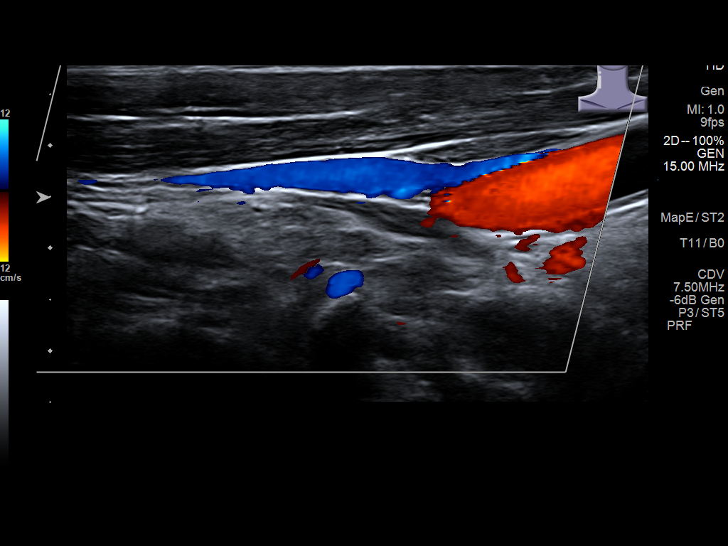
[im 95/95]
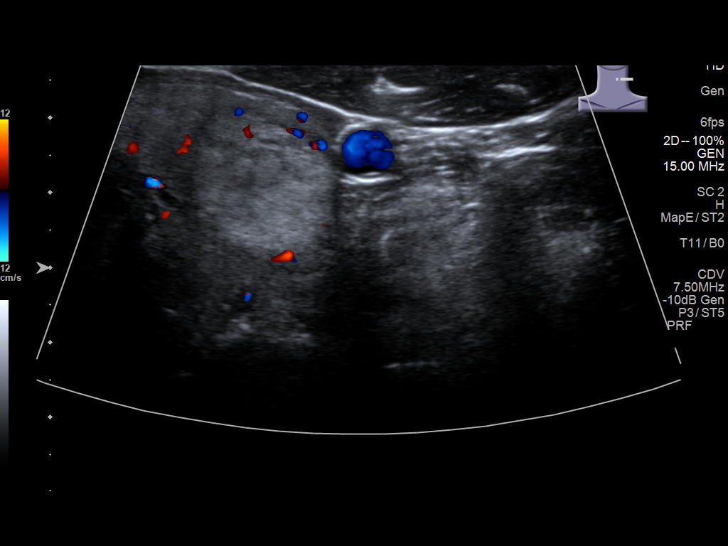

[13 of 25 positions shown; findings below may reference images not displayed]

FINDINGS: Parenchymal Echotexture: Moderately heterogenous

Isthmus: 0.6 cm

Right lobe: 4.9 x 1.9 x 1.8 cm

Left lobe: 6.1 x 3.1 x 3.8 cm

_________________________________________________________

Estimated total number of nodules >/= 1 cm: 1

Number of spongiform nodules >/=  2 cm not described below (TR1): 0

Number of mixed cystic and solid nodules >/= 1.5 cm not described
below (TR2): 0

_________________________________________________________

A couple of subcentimeter nodules in the right thyroid lobe do not
meet criteria for dedicated follow-up or biopsy.

Nodule # 3:

Location: Left; Mid

Maximum size: 4.9 cm; Other 2 dimensions: 3.5 x 3.3 cm; previously
measuring up to 3.6 cm in November 2010

Composition: solid/almost completely solid (2)

Echogenicity: hyperechoic (1)

Shape: not taller-than-wide (0)

Margins: smooth (0)

Echogenic foci: punctate echogenic foci (3)

ACR TI-RADS total points: 6.

ACR TI-RADS risk category: TR4 (4-6 points).

ACR TI-RADS recommendations:

Previously biopsied in December 2010

_________________________________________________________
IMPRESSION: 1. There is a 4.9 cm TR 4 nodule in the left thyroid lobe, which has
demonstrated slow interval growth since November 2010 when it
measured 3.6 cm. This nodule was previously biopsied in December 2010. Correlate with biopsy results.
2. No new suspicious nodules that meet criteria for further
dedicated follow-up or biopsy.

The above is in keeping with the ACR TI-RADS recommendations - [HOSPITAL] 8252;[DATE].

## 2023-12-05 ENCOUNTER — Encounter: Payer: Self-pay | Admitting: Radiology
